# Patient Record
Sex: Female | Born: 2000
Health system: Southern US, Community
[De-identification: ages and names within clinical notes are randomized; demographics above are authoritative.]

## PROBLEM LIST (undated history)

## (undated) DIAGNOSIS — T7840XA Allergy, unspecified, initial encounter: Secondary | ICD-10-CM

## (undated) DIAGNOSIS — K121 Other forms of stomatitis: Secondary | ICD-10-CM

## (undated) DIAGNOSIS — L709 Acne, unspecified: Secondary | ICD-10-CM

## (undated) DIAGNOSIS — R011 Cardiac murmur, unspecified: Secondary | ICD-10-CM

## (undated) HISTORY — DX: Allergy, unspecified, initial encounter: T78.40XA

## (undated) HISTORY — DX: Acne, unspecified: L70.9

## (undated) HISTORY — DX: Cardiac murmur, unspecified: R01.1

## (undated) HISTORY — DX: Other forms of stomatitis: K12.1

---
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
## 2022-01-19 ENCOUNTER — Other Ambulatory Visit (HOSPITAL_COMMUNITY): Payer: Self-pay

## 2022-01-21 ENCOUNTER — Ambulatory Visit (INDEPENDENT_AMBULATORY_CARE_PROVIDER_SITE_OTHER): Payer: 59 | Admitting: Psychology

## 2022-01-21 ENCOUNTER — Encounter: Payer: Self-pay | Admitting: Family Medicine

## 2022-01-21 DIAGNOSIS — F411 Generalized anxiety disorder: Secondary | ICD-10-CM

## 2022-01-21 NOTE — Progress Notes (Signed)
[facility] Counselor/Therapist Progress Note  Patient ID: Sarah Herrera, MRN: [id number],    Date: 01/21/2022  Time Spent: 60 minutes  Treatment Type: Individual Therapy  Reported Symptoms: anxiety, worry  Mental Status Exam: Appearance:  Casual     Behavior: Appropriate  Motor: Restlestness  Speech/Language:  Normal Rate  Affect: Appropriate  Mood: anxious  Thought process: normal  Thought content:   WNL  Sensory/Perceptual disturbances:   WNL  Orientation: oriented to person, place, time/date, and situation  Attention: Good  Concentration: Good  Memory: WNL  Fund of knowledge:  Good  Insight:   Good  Judgment:  Good  Impulse Control: Good   Risk Assessment: Danger to Self:  No Self-injurious Behavior: No Danger to Others: No Duty to Warn:no Physical Aggression / Violence:No  Access to Firearms a concern: No  Gang Involvement:No   Subjective: The patient was seen today for an individual  therapy session via video visit.  The patient gave verbal consent for the session to be a video on WebEx.  The patient was in her room alone and therapist was in the office.  The patient presents as pleasant and cooperative.  The patient seems a little less anxious today than she has.  The patient reports that she is finished with school and she is supposed to graduate in 6 days.  The patient states that she went to the prom and to a [employer] and all of those things were great.  She still struggles with going back and forth between her parents and feeling supported by her father's family specifically. We talked about her going to [place] next week and she is excited about that.  We also discussed her going to [employer] and she feels excited about that possibility as well.  I am sure she will have more anxiety but closer to the time comes for her to leave however we will deal with that as we go.  Interventions: Cognitive Behavioral Therapy,  Assertiveness/Communication, and Insight-Oriented  Diagnosis:Generalized anxiety disorder  Plan: Please see plan in Therapy charts with target date of 03/04/2022 for goals and progress.  The patient approved this plan and will be seen monthly.  Jacqulyn Barresi G Danice Dippolito, LCSW                  Shantel Wesely G Charm Stenner, LCSW               Ramonita Koenig G Chirstine Defrain, LCSW               Timi Reeser G Kimball Appleby, LCSW

## 2022-02-18 ENCOUNTER — Ambulatory Visit (INDEPENDENT_AMBULATORY_CARE_PROVIDER_SITE_OTHER): Payer: 59 | Admitting: Psychology

## 2022-02-18 DIAGNOSIS — F411 Generalized anxiety disorder: Secondary | ICD-10-CM | POA: Diagnosis not present

## 2022-02-18 NOTE — Progress Notes (Signed)
[facility] Counselor/Therapist Progress Note  Patient ID: Sarah Herrera, MRN: [id number],    Date: 02/18/2022  Time Spent: 60 minutes  Treatment Type: Individual Therapy  Reported Symptoms: anxiety, worry  Mental Status Exam: Appearance:  Casual     Behavior: Appropriate  Motor: Restlestness  Speech/Language:  Normal Rate  Affect: Appropriate  Mood: normal  Thought process: normal  Thought content:   WNL  Sensory/Perceptual disturbances:   WNL  Orientation: oriented to person, place, time/date, and situation  Attention: Good  Concentration: Good  Memory: WNL  Fund of knowledge:  Good  Insight:   Good  Judgment:  Good  Impulse Control: Good   Risk Assessment: Danger to Self:  No Self-injurious Behavior: No Danger to Others: No Duty to Warn:no Physical Aggression / Violence:No  Access to Firearms a concern: No  Gang Involvement:No   Subjective: The patient was seen today for an individual  therapy session via video visit.  The patient gave verbal consent for the session to be a video on WebEx.  The patient was in her room alone and therapist was in the office.  The patient presents as pleasant and cooperative.  The patient graduated from high school and went to [place] since I saw her last.  The patient reports that she is doing very well right now.  She is doing better with her anxiety because I think she has decided that sometimes it is other people's problems as opposed to hers and she is not trying to please everyone anymore.  We talked about her continuing that attitude when she goes to college and she is feeling very excited about her summer and going off to school.  She will likely get a therapist when she goes to state and we will discontinue our sessions at that point in time.  Interventions: Cognitive Behavioral Therapy, Assertiveness/Communication, and Insight-Oriented  Diagnosis:Generalized anxiety disorder  Plan: Client Abilities/Strengths   Intelligent, insightful, family support  Client Treatment Preferences  Outpatient Individual therapy  Client Statement of Needs  "I need to learn to manage my stress and anxiety"  Treatment Level  Outpatient Individual therapy  Symptoms  Excessive anxiety, worry, or fear that markedly exceeds the normal level for the client's stage of  development.:  (Status: improved). High level of motor tension, such as  restlessness, tiredness, shakiness, or muscle tension.:  (Status: improved).  Problems Addressed  Anxiety  Goals 1. Stabilize anxiety level while increasing ability to function on a daily  basis. Objective Learn and implement calming skills to reduce overall anxiety and manage anxiety symptoms. Target Date: 2023-03-05 Frequency: Monthly Progress: 60 Modality: individual Related Interventions 1. Teach the client calming skills (e.g., progressive muscle relaxation, guided imagery, slow  diaphragmatic breathing) and how to discriminate better between relaxation and tension; teach  the client how to apply these skills to his/her daily life. Objective Identify, challenge, and replace fearful self-talk with positive, realistic, and empowering self-talk. Target Date: 2023-03-05 Frequency: Monthly Progress: 60 Modality: individual Related Interventions 1. Explore the client's schema and self-talk that mediate his/her fear response; challenge the  biases; assist him/her in replacing the distorted messages with reality-based alternatives and  positive self-talk that will increase his/her self-confidence in coping with irrational fears or  worries. Objective Describe current and past experiences with specific fears, prominent worries, and anxiety symptoms,  including their impact on functioning and attempts to resolve them. Target Date: 2023-03-05 Frequency: Monthly Progress: 60 Modality: individual Related Interventions 1. Assess the focus, excessiveness, and  uncontrollability of  the client's fears and worries, and the  type, frequency, intensity, and duration of his/her anxiety symptoms (e.g., use The Anxiety  Disorders Interview Schedule - Parent Version or Child Version; consider assigning "Finding  and Losing Your Anxiety" and/or "What Makes Me Anxious" in the Adolescent Psychotherapy  Homework Planner by Sevier Blas, and McInnis). 2. Actively build the level of trust with the client through consistent eye contact, active listening,  unconditional positive regard, and warm acceptance to help increase his/her ability to identify  and express concerns. Objective Verbalize an understanding of how thoughts, physical feelings, and behavioral actions contribute to  anxiety and its treatment. Target Date: 2023-03-05 Frequency: Monthly Progress: 60 Modality: individual  Diagnosis  F41.1 Generalized Anxiety  Disorder  Conditions For Discharge Achievement of treatment goals and objectives.  The patient approved this plan and will be seen monthly.  Khai Torbert G Adely Facer, LCSW                  Paullette Mckain G Deb Loudin, LCSW               Coolidge Gossard G Jolyne Laye, LCSW               Kiaraliz Rafuse G Scottlynn Lindell, LCSW               Calan Doren G Nikea Settle, LCSW

## 2022-03-17 ENCOUNTER — Encounter: Payer: Self-pay | Admitting: Family Medicine

## 2022-03-17 ENCOUNTER — Ambulatory Visit (INDEPENDENT_AMBULATORY_CARE_PROVIDER_SITE_OTHER): Payer: 59 | Admitting: *Deleted

## 2022-03-17 DIAGNOSIS — Z23 Encounter for immunization: Secondary | ICD-10-CM

## 2022-03-19 ENCOUNTER — Ambulatory Visit (INDEPENDENT_AMBULATORY_CARE_PROVIDER_SITE_OTHER): Payer: 59 | Admitting: Psychology

## 2022-03-19 DIAGNOSIS — F411 Generalized anxiety disorder: Secondary | ICD-10-CM

## 2022-03-19 NOTE — Progress Notes (Signed)
[facility] Counselor/Therapist Progress Note  Patient ID: Sarah Herrera, MRN: [id number],    Date: 03/19/2022  Time Spent: 60 minutes  Treatment Type: Individual Therapy  Reported Symptoms: anxiety, worry  Mental Status Exam: Appearance:  Casual     Behavior: Appropriate  Motor: Restlestness  Speech/Language:  Normal Rate  Affect: Appropriate  Mood: normal  Thought process: normal  Thought content:   WNL  Sensory/Perceptual disturbances:   WNL  Orientation: oriented to person, place, time/date, and situation  Attention: Good  Concentration: Good  Memory: WNL  Fund of knowledge:  Good  Insight:   Good  Judgment:  Good  Impulse Control: Good   Risk Assessment: Danger to Self:  No Self-injurious Behavior: No Danger to Others: No Duty to Warn:no Physical Aggression / Violence:No  Access to Firearms a concern: No  Gang Involvement:No   Subjective: The patient was seen today for an individual  therapy session via video visit.  The patient gave verbal consent for the session to be a video on WebEx.  The patient was in her room alone and therapist was in the office.  The patient presents as pleasant and cooperative but seems a little more anxious this time.  The patient states that she is having difficulty with her family members.  We talked about how they are responding to her and I encouraged her not to take what they are doing personally as it seems they do not know how to communicate very well with her.  Encouraged her to continue to focus on the good things that are going on with her as she is going to college within the month.  The patient plans to try to find a therapist over and [facility] since she will be going to state .I have her scheduled one more time between now and the time that she moves.  We will begin termination process. Interventions: Cognitive Behavioral Therapy, Assertiveness/Communication, and Insight-Oriented  Diagnosis:Generalized anxiety  disorder  Plan: Client Abilities/Strengths  Intelligent, insightful, family support  Client Treatment Preferences  Outpatient Individual therapy  Client Statement of Needs  "I need to learn to manage my stress and anxiety"  Treatment Level  Outpatient Individual therapy  Symptoms  Excessive anxiety, worry, or fear that markedly exceeds the normal level for the client's stage of  development.:  (Status: improved). High level of motor tension, such as  restlessness, tiredness, shakiness, or muscle tension.:  (Status: improved).  Problems Addressed  Anxiety  Goals 1. Stabilize anxiety level while increasing ability to function on a daily  basis. Objective Learn and implement calming skills to reduce overall anxiety and manage anxiety symptoms. Target Date: 2023-03-05 Frequency: Monthly Progress: 60 Modality: individual Related Interventions 1. Teach the client calming skills (e.g., progressive muscle relaxation, guided imagery, slow  diaphragmatic breathing) and how to discriminate better between relaxation and tension; teach  the client how to apply these skills to his/her daily life. Objective Identify, challenge, and replace fearful self-talk with positive, realistic, and empowering self-talk. Target Date: 2023-03-05 Frequency: Monthly Progress: 60 Modality: individual Related Interventions 1. Explore the client's schema and self-talk that mediate his/her fear response; challenge the  biases; assist him/her in replacing the distorted messages with reality-based alternatives and  positive self-talk that will increase his/her self-confidence in coping with irrational fears or  worries. Objective Describe current and past experiences with specific fears, prominent worries, and anxiety symptoms,  including their impact on functioning and attempts to resolve them. Target Date: 2023-03-05 Frequency: Monthly Progress:  60 Modality: individual Related Interventions 1. Assess the  focus, excessiveness, and uncontrollability of the client's fears and worries, and the  type, frequency, intensity, and duration of his/her anxiety symptoms (e.g., use The Anxiety  Disorders Interview Schedule - Parent Version or Child Version; consider assigning "Finding  and Losing Your Anxiety" and/or "What Makes Me Anxious" in the Adolescent Psychotherapy  Homework Planner by Mason Blas, and McInnis). 2. Actively build the level of trust with the client through consistent eye contact, active listening,  unconditional positive regard, and warm acceptance to help increase his/her ability to identify  and express concerns. Objective Verbalize an understanding of how thoughts, physical feelings, and behavioral actions contribute to  anxiety and its treatment. Target Date: 2023-03-05 Frequency: Monthly Progress: 60 Modality: individual  Diagnosis  F41.1 Generalized Anxiety  Disorder  Conditions For Discharge Achievement of treatment goals and objectives.  The patient approved this plan and will be seen monthly.  Eureka Valdes G Darnell Jeschke, LCSW                  Rashon Rezek G Aneth Schlagel, LCSW               Nikayla Madaris G Wen Munford, LCSW               Sundae Maners G Shevonne Wolf, LCSW               Machai Desmith G Yareliz Thorstenson, LCSW               Shawntavia Saunders G Esther Broyles, LCSW

## 2022-03-23 ENCOUNTER — Ambulatory Visit: Payer: 59 | Admitting: Dermatology

## 2022-03-23 DIAGNOSIS — L700 Acne vulgaris: Secondary | ICD-10-CM | POA: Diagnosis not present

## 2022-03-23 MED ORDER — WINLEVI 1 % EX CREA: TOPICAL_CREAM | CUTANEOUS | 6 refills | Status: DC

## 2022-03-23 MED ORDER — ARAZLO 0.045 % EX LOTN: TOPICAL_LOTION | CUTANEOUS | 6 refills | Status: DC

## 2022-04-07 ENCOUNTER — Ambulatory Visit: Payer: 59 | Admitting: Psychology

## 2022-04-13 ENCOUNTER — Ambulatory Visit (INDEPENDENT_AMBULATORY_CARE_PROVIDER_SITE_OTHER): Payer: 59 | Admitting: Psychology

## 2022-04-13 DIAGNOSIS — F411 Generalized anxiety disorder: Secondary | ICD-10-CM | POA: Diagnosis not present

## 2022-04-13 NOTE — Progress Notes (Signed)
[facility] Counselor/Therapist Progress Note  Patient ID: TASHAE INDA, MRN: [id number],    Date: 04/13/2022  Time Spent: 55 minutes  Treatment Type: Individual Therapy  Reported Symptoms: anxiety, worry  Mental Status Exam: Appearance:  Casual     Behavior: Appropriate  Motor: normal  Speech/Language:  Normal Rate  Affect: Appropriate  Mood: normal  Thought process: normal  Thought content:   WNL  Sensory/Perceptual disturbances:   WNL  Orientation: oriented to person, place, time/date, and situation  Attention: Good  Concentration: Good  Memory: WNL  Fund of knowledge:  Good  Insight:   Good  Judgment:  Good  Impulse Control: Good   Risk Assessment: Danger to Self:  No Self-injurious Behavior: No Danger to Others: No Duty to Warn:no Physical Aggression / Violence:No  Access to Firearms a concern: No  Gang Involvement:No   Subjective: The patient was seen today for an individual  therapy session via video visit.  The patient gave verbal consent for the session to be a video on WebEx.  The patient was in her room alone and therapist was in the office.  The patient presents as pleasant and cooperative .  The patient reports that she is going to school on August 17.  She has several of her things already packed and she is excited about going.  We talked about how she feels about things and she seems to be doing better with managing her emotions and her anxiety because it seems that she is not going to have to go back and forth between her parents and try to please everyone.  The patient states that she had a good time when she went to her orientation a few weeks ago and feels good about making friends and about acclimating to the surroundings.  The patient reports that she does not necessarily want to start therapy with a new therapist they are because it seems that what they have to offer is a different therapist every time they meet.  The patient states that  she might try therapy if she needs to with them and then will cancel appointments as needed.  I made 2 appointments for her in the next semester so that she would have some check-in if needed.  She is aware that she can contact me if she needs to be seen sooner.  Interventions: Cognitive Behavioral Therapy, Assertiveness/Communication, and Insight-Oriented  Diagnosis:Generalized anxiety disorder  Plan: Client Abilities/Strengths  Intelligent, insightful, family support  Client Treatment Preferences  Outpatient Individual therapy  Client Statement of Needs  "I need to learn to manage my stress and anxiety"  Treatment Level  Outpatient Individual therapy  Symptoms  Excessive anxiety, worry, or fear that markedly exceeds the normal level for the client's stage of  development.:  (Status: improved). High level of motor tension, such as  restlessness, tiredness, shakiness, or muscle tension.:  (Status: improved).  Problems Addressed  Anxiety  Goals 1. Stabilize anxiety level while increasing ability to function on a daily  basis. Objective Learn and implement calming skills to reduce overall anxiety and manage anxiety symptoms. Target Date: 2023-03-05 Frequency: Monthly Progress: 60 Modality: individual Related Interventions 1. Teach the client calming skills (e.g., progressive muscle relaxation, guided imagery, slow  diaphragmatic breathing) and how to discriminate better between relaxation and tension; teach  the client how to apply these skills to his/her daily life. Objective Identify, challenge, and replace fearful self-talk with positive, realistic, and empowering self-talk. Target Date: 2023-03-05 Frequency: Monthly Progress: 60  Modality: individual Related Interventions 1. Explore the client's schema and self-talk that mediate his/her fear response; challenge the  biases; assist him/her in replacing the distorted messages with reality-based alternatives and  positive  self-talk that will increase his/her self-confidence in coping with irrational fears or  worries. Objective Describe current and past experiences with specific fears, prominent worries, and anxiety symptoms,  including their impact on functioning and attempts to resolve them. Target Date: 2023-03-05 Frequency: Monthly Progress: 60 Modality: individual Related Interventions 1. Assess the focus, excessiveness, and uncontrollability of the client's fears and worries, and the  type, frequency, intensity, and duration of his/her anxiety symptoms (e.g., use The Anxiety  Disorders Interview Schedule - Parent Version or Child Version; consider assigning "Finding  and Losing Your Anxiety" and/or "What Makes Me Anxious" in the Adolescent Psychotherapy  Homework Planner by [place] Blas, and McInnis). 2. Actively build the level of trust with the client through consistent eye contact, active listening,  unconditional positive regard, and warm acceptance to help increase his/her ability to identify  and express concerns. Objective Verbalize an understanding of how thoughts, physical feelings, and behavioral actions contribute to  anxiety and its treatment. Target Date: 2023-03-05 Frequency: Monthly Progress: 60 Modality: individual  Diagnosis  F41.1 Generalized Anxiety  Disorder  Conditions For Discharge Achievement of treatment goals and objectives.  The patient approved this plan and will be seen monthly.  Azalya Galyon G Ishani Goldwasser, LCSW                  Trinidee Schrag G Fifi Schindler, LCSW               Ahmoni Edge G Quinesha Selinger, LCSW               Johnston Maddocks G Tekoa Hamor, LCSW               Arlyn Buerkle G Mitch Arquette, LCSW               Sue Mcalexander G Esaiah Wanless, LCSW               Cordel Drewes G Dorothymae Maciver, LCSW

## 2022-04-14 ENCOUNTER — Encounter: Payer: Self-pay | Admitting: Dermatology

## 2022-04-14 NOTE — Progress Notes (Signed)
   Follow-Up Visit   Subjective  Sarah Herrera is a 21 y.o. female who presents for the following: Acne ( Here for acne breakout. Patient was on Accutane 2 years ago. Now she is breaking out again. No treatment at this time. ).  Acne flaring, history of clearance on estrogen Location:  Duration:  Quality:  Associated Signs/Symptoms: Modifying Factors:  Severity:  Timing: Context:   Objective  Well appearing patient in no apparent distress; mood and affect are within normal limits. Head - Anterior (Face) Mostly superficial inflammatory, mostly facial acne.  All treatment options reviewed including repeat course of isotretinoin which undoubtedly would work but she may respond to less tedious options.    A focused examination was performed including head and neck.. Relevant physical exam findings are noted in the Assessment and Plan.   Assessment & Plan    Acne vulgaris Head - Anterior (Face)  Will initially use topical clascoterone 5 nights weekly, Arazlo on Monday and Friday night.  10-week trial and contact office with follow-up status  Tazarotene (ARAZLO) 0.045 % LOTN - Head - Anterior (Face) Apply to affected Monday & friday  Clascoterone (WINLEVI) 1 % CREA - Head - Anterior (Face) Apply to affected area qhs -Tuesday, weds, sat & Sunday      I, Janalyn Harder, MD, have reviewed all documentation for this visit.  The documentation on 04/14/22 for the exam, diagnosis, procedures, and orders are all accurate and complete.

## 2022-04-19 ENCOUNTER — Ambulatory Visit (INDEPENDENT_AMBULATORY_CARE_PROVIDER_SITE_OTHER): Payer: 59 | Admitting: *Deleted

## 2022-04-19 DIAGNOSIS — Z23 Encounter for immunization: Secondary | ICD-10-CM | POA: Diagnosis not present

## 2022-04-20 ENCOUNTER — Other Ambulatory Visit: Payer: Self-pay | Admitting: Family Medicine

## 2022-04-20 DIAGNOSIS — K219 Gastro-esophageal reflux disease without esophagitis: Secondary | ICD-10-CM

## 2022-04-21 ENCOUNTER — Other Ambulatory Visit (HOSPITAL_COMMUNITY): Payer: Self-pay

## 2022-04-21 MED ORDER — OMEPRAZOLE 40 MG PO CPDR
40.0000 mg | DELAYED_RELEASE_CAPSULE | Freq: Every day | ORAL | 1 refills | Status: DC
  Filled 2022-04-21: qty 30, 30d supply, fill #0
  Filled 2022-12-28: qty 30, 30d supply, fill #1

## 2022-06-15 ENCOUNTER — Ambulatory Visit (INDEPENDENT_AMBULATORY_CARE_PROVIDER_SITE_OTHER): Payer: 59 | Admitting: Psychology

## 2022-06-15 DIAGNOSIS — F411 Generalized anxiety disorder: Secondary | ICD-10-CM

## 2022-06-16 NOTE — Progress Notes (Signed)
[facility] Counselor/Therapist Progress Note  Patient ID: Sarah Herrera, MRN: [id number],    Date: 06/15/2022  Time Spent: 60 minutes  Treatment Type: Individual Therapy  Reported Symptoms: anxiety, worry  Mental Status Exam: Appearance:  Casual     Behavior: Appropriate  Motor: normal  Speech/Language:  Normal Rate  Affect: Appropriate  Mood: normal  Thought process: normal  Thought content:   WNL  Sensory/Perceptual disturbances:   WNL  Orientation: oriented to person, place, time/date, and situation  Attention: Good  Concentration: Good  Memory: WNL  Fund of knowledge:  Good  Insight:   Good  Judgment:  Good  Impulse Control: Good   Risk Assessment: Danger to Self:  No Self-injurious Behavior: No Danger to Others: No Duty to Warn:no Physical Aggression / Violence:No  Access to Firearms a concern: No  Gang Involvement:No   Subjective: The patient was seen today for an individual  therapy session via video visit.  The patient gave verbal consent for the session to be a video on WebEx.  The patient was in her room alone and therapist was in the office.  The patient presents as pleasant and cooperative .  The patient does not seem as anxious as she did when she was living at home.  The patient is now at school and reports that things seem to be going well.  The patient states that she has made several friends and is doing well with her classes.  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  We talked about her continuing to confront issues as they arise instead of letting things simmer.  She states that she has been doing more of this and she said that I would be very proud of her for confronting issues as they arise.  The patient did have some anxiety about the situation and is a real and how people are perceiving persons of Jewish heritage.  We talked about her continuing to keep herself safe  and trying to stay as positive as possible.  The patient seems to be doing very well and would like to continue to have periodic sessions to check in as she has not started any kind of therapy over and [place].  Interventions: Cognitive Behavioral Therapy, Assertiveness/Communication, and Insight-Oriented  Diagnosis:Generalized anxiety disorder  Plan: Client Abilities/Strengths  Intelligent, insightful, family support  Client Treatment Preferences  Outpatient Individual therapy  Client Statement of Needs  "I need to learn to manage my stress and anxiety"  Treatment Level  Outpatient Individual therapy  Symptoms  Excessive anxiety, worry, or fear that markedly exceeds the normal level for the client's stage of  development.:  (Status: improved). High level of motor tension, such as  restlessness, tiredness, shakiness, or muscle tension.:  (Status: improved).  Problems Addressed  Anxiety  Goals 1. Stabilize anxiety level while increasing ability to function on a daily  basis. Objective Learn and implement calming skills to reduce overall anxiety and manage anxiety symptoms. Target Date: 2023-03-05 Frequency: Monthly Progress: 60 Modality: individual Related Interventions 1. Teach the client calming skills (e.g., progressive muscle relaxation, guided imagery, slow  diaphragmatic breathing) and how to discriminate better between relaxation and tension; teach  the client how to apply these skills to his/her daily life. Objective Identify, challenge, and replace fearful self-talk with positive, realistic, and empowering self-talk. Target Date: 2023-03-05 Frequency: Monthly Progress: 60 Modality: individual Related Interventions 1. Explore  the client's schema and self-talk that mediate his/her fear response; challenge the  biases; assist him/her in replacing the distorted messages with reality-based alternatives and  positive self-talk that will increase his/her self-confidence in  coping with irrational fears or  worries. Objective Describe current and past experiences with specific fears, prominent worries, and anxiety symptoms,  including their impact on functioning and attempts to resolve them. Target Date: 2023-03-05 Frequency: Monthly Progress: 60 Modality: individual Related Interventions 1. Assess the focus, excessiveness, and uncontrollability of the client's fears and worries, and the  type, frequency, intensity, and duration of his/her anxiety symptoms (e.g., use The Anxiety  Disorders Interview Schedule - Parent Version or Child Version; consider assigning "Finding  and Losing Your Anxiety" and/or "What Makes Me Anxious" in the Adolescent Psychotherapy  Homework Planner by Baird Cancer, and McInnis). 2. Actively build the level of trust with the client through consistent eye contact, active listening,  unconditional positive regard, and warm acceptance to help increase his/her ability to identify  and express concerns. Objective Verbalize an understanding of how thoughts, physical feelings, and behavioral actions contribute to  anxiety and its treatment. Target Date: 2023-03-05 Frequency: Monthly Progress: 60 Modality: individual  Diagnosis  F41.1 Generalized Anxiety  Disorder  Conditions For Discharge Achievement of treatment goals and objectives.  The patient approved this plan and will be seen monthly.  Colby Reels G Corian Handley, LCSW

## 2022-07-13 ENCOUNTER — Encounter: Payer: Self-pay | Admitting: Family Medicine

## 2022-07-14 DIAGNOSIS — R590 Localized enlarged lymph nodes: Secondary | ICD-10-CM | POA: Diagnosis not present

## 2022-07-28 ENCOUNTER — Ambulatory Visit: Payer: 59 | Admitting: Family Medicine

## 2022-08-11 ENCOUNTER — Ambulatory Visit (INDEPENDENT_AMBULATORY_CARE_PROVIDER_SITE_OTHER): Payer: 59 | Admitting: Psychology

## 2022-08-11 DIAGNOSIS — F411 Generalized anxiety disorder: Secondary | ICD-10-CM | POA: Diagnosis not present

## 2022-08-11 NOTE — Progress Notes (Signed)
[place] [facility] Counselor/Therapist Progress Note  Patient ID: Sarah Herrera, MRN: [id number],    Date: 08/11/2022  Time Spent: 60 minutes  Treatment Type: Individual Therapy  Reported Symptoms: anxiety, worry  Mental Status Exam: Appearance:  Casual     Behavior: Appropriate  Motor: normal  Speech/Language:  Normal Rate  Affect: Appropriate  Mood: normal  Thought process: normal  Thought content:   WNL  Sensory/Perceptual disturbances:   WNL  Orientation: oriented to person, place, time/date, and situation  Attention: Good  Concentration: Good  Memory: WNL  Fund of knowledge:  Good  Insight:   Good  Judgment:  Good  Impulse Control: Good   Risk Assessment: Danger to Self:  No Self-injurious Behavior: No Danger to Others: No Duty to Warn:no Physical Aggression / Violence:No  Access to Firearms a concern: No  Gang Involvement:No   Subjective: The patient was seen today for an individual  therapy session via video visit.  The patient gave verbal consent for the session to be a video on WebEx.  The patient was in outside alone and therapist was in the office.  The patient presents as pleasant and cooperative .  The patient states that the semester is almost over.  He has been doing well and she reports that her anxiety has been manageable.  She reports that she is in a new relationship and we talked about this and we talked about some of the things that make it a healthy relationship.  She states that she is going home next Tuesday and feels a little anxious about this but seems to be handling things well.  She does report that there are some problems with antisimilar to zone at her school and she states that she is not wearing any outward signs of her faith because of concerns about that.  We talked about how she is coping and she seems to be doing relatively well we scheduled another appointment in February. Interventions: Cognitive Behavioral Therapy,  Assertiveness/Communication, and Insight-Oriented  Diagnosis:Generalized anxiety disorder  Plan: Client Abilities/Strengths  Intelligent, insightful, family support  Client Treatment Preferences  Outpatient Individual therapy  Client Statement of Needs  "I need to learn to manage my stress and anxiety"  Treatment Level  Outpatient Individual therapy  Symptoms  Excessive anxiety, worry, or fear that markedly exceeds the normal level for the client's stage of  development.:  (Status: improved). High level of motor tension, such as  restlessness, tiredness, shakiness, or muscle tension.:  (Status: improved).  Problems Addressed  Anxiety  Goals 1. Stabilize anxiety level while increasing ability to function on a daily  basis. Objective Learn and implement calming skills to reduce overall anxiety and manage anxiety symptoms. Target Date: 2023-03-05 Frequency: Monthly Progress: 60 Modality: individual Related Interventions 1. Teach the client calming skills (e.g., progressive muscle relaxation, guided imagery, slow  diaphragmatic breathing) and how to discriminate better between relaxation and tension; teach  the client how to apply these skills to his/her daily life. Objective Identify, challenge, and replace fearful self-talk with positive, realistic, and empowering self-talk. Target Date: 2023-03-05 Frequency: Monthly Progress: 60 Modality: individual Related Interventions 1. Explore the client's schema and self-talk that mediate his/her fear response; challenge the  biases; assist him/her in replacing the distorted messages with reality-based alternatives and  positive self-talk that will increase his/her self-confidence in coping with irrational fears or  worries. Objective Describe current and past experiences with specific fears, prominent worries, and anxiety symptoms,  including their impact on functioning  and attempts to resolve them. Target Date: 2023-03-05 Frequency:  Monthly Progress: 60 Modality: individual Related Interventions 1. Assess the focus, excessiveness, and uncontrollability of the client's fears and worries, and the  type, frequency, intensity, and duration of his/her anxiety symptoms (e.g., use The Anxiety  Disorders Interview Schedule - Parent Version or Child Version; consider assigning "Finding  and Losing Your Anxiety" and/or "What Makes Me Anxious" in the Adolescent Psychotherapy  Homework Planner by [place] Blas, and McInnis). 2. Actively build the level of trust with the client through consistent eye contact, active listening,  unconditional positive regard, and warm acceptance to help increase his/her ability to identify  and express concerns. Objective Verbalize an understanding of how thoughts, physical feelings, and behavioral actions contribute to  anxiety and its treatment. Target Date: 2023-03-05 Frequency: Monthly Progress: 60 Modality: individual  Diagnosis  F41.1 Generalized Anxiety  Disorder  Conditions For Discharge Achievement of treatment goals and objectives.  The patient approved this plan and will be seen monthly.  Sarah Herrera G Helga Asbury, LCSW

## 2022-08-13 ENCOUNTER — Ambulatory Visit: Payer: 59 | Admitting: Psychology

## 2022-08-18 NOTE — Progress Notes (Unsigned)
HPI: Sarah Herrera is a 21 y.o. female with PMHx significant for migraine headaches without aura,anxiety,and IBS here today to discuss birth control options.  She reports having taken an estrogen-based pill approximately a year ago but experienced headaches and exacerbation of migraines, leading her to discontinue its use. She is now interested in starting a progesterone-only pill, as she still experiences some headaches, albeit not migraines, two days before her mentrual period, not severe.  The patient confirms she is sexually active and her last menstrual period was on December 9th/2023. She describes her periods as heavy for a day or two and associated cramps.States that she is very consistent and regular. She denies any abnormal vaginal bleeding or discharge.  Additionally, she mentions a "bump" behind left ear present for about a month and a half. She reports that it has gotten smaller and is no longer painful. [facility] problem [facility] noted and positive. States that she has not had associated symptoms like sore throat,fever, night sweats,fatigue, cough,or abnormal wt loss. She also reports having a normal CBC.  Review of Systems  Constitutional:  Negative for activity change and appetite change.  HENT:  Negative for ear pain, mouth sores and nosebleeds.   Eyes:  Negative for redness and visual disturbance.  Respiratory:  Negative for shortness of breath and wheezing.   Cardiovascular:  Negative for chest pain, palpitations and leg swelling.  Gastrointestinal:  Negative for abdominal pain, nausea and vomiting.       Negative for changes in bowel habits.  Genitourinary:  Negative for decreased urine volume, dysuria and hematuria.  Musculoskeletal:  Negative for arthralgias and myalgias.  Skin:  Negative for rash.  Neurological:  Negative for syncope, facial asymmetry and weakness.  Hematological:  Negative for adenopathy. Does not bruise/bleed easily.   See other pertinent positives and negatives in HPI.  Current Outpatient Medications on File Prior to Visit  Medication Sig Dispense Refill   omeprazole (PRILOSEC) 40 MG capsule Take 1 capsule by mouth daily. 30 capsule 1   No current facility-administered medications on file prior to visit.   Past Medical History:  Diagnosis Date   Acne    Allergy    Heart murmur    as an infant   Mouth ulcers    No Known Allergies  Social History   Socioeconomic History   Marital status: Single    Spouse name: Not on file   Number of children: Not on file   Years of education: Not on file   Highest education level: 12th grade  Occupational History   Not on file  Tobacco Use   Smoking status: Never   Smokeless tobacco: Never  Vaping Use   Vaping Use: Never used  Substance and Sexual Activity   Alcohol use: Never   Drug use: Never   Sexual activity: Not on file  Other Topics Concern   Not on file  Social History Narrative   Not on file   Social Determinants of Health   Financial Resource Strain: Low Risk  (08/20/2022)   Overall Financial Resource Strain (CARDIA)    Difficulty of Paying Living Expenses: Not hard at all  Food Insecurity: No Food Insecurity (08/20/2022)   Hunger Vital Sign    Worried About Running Out of Food in the Last Year: Never true    Ran Out of Food in the Last Year: Never true  Transportation Needs: No Transportation Needs (08/20/2022)   PRAPARE - Transportation  Lack of Transportation (Medical): No    Lack of Transportation (Non-Medical): No  Physical Activity: Sufficiently Active (08/20/2022)   Exercise Vital Sign    Days of Exercise per Week: 7 days    Minutes of Exercise per Session: 60 min  Stress: No Stress Concern Present (08/20/2022)   [employer] of Occupational Health - Occupational Stress Questionnaire    Feeling of Stress : Only a little  Social Connections: Moderately Integrated (08/20/2022)   Social Connection and Isolation  Panel [NHANES]    Frequency of Communication with Friends and Family: More than three times a week    Frequency of Social Gatherings with Friends and Family: More than three times a week    Attends Religious Services: More than 4 times per year    Active Member of Clubs or Organizations: Yes    Attends Banker Meetings: More than 4 times per year    Marital Status: Never married    Vitals:   08/20/22 0942  BP: 118/70  Pulse: 60  Resp: 12  Temp: 98.6 F (37 C)  SpO2: 98%   Body mass index is 21.5 kg/m.  Physical Exam Vitals and nursing note reviewed.  Constitutional:      General: She is not in acute distress.    Appearance: She is well-developed.  HENT:     Head: Normocephalic and atraumatic.     Right Ear: Tympanic membrane, ear canal and external ear normal.     Left Ear: Tympanic membrane, ear canal and external ear normal.     Ears:      Mouth/Throat:     Mouth: Mucous membranes are moist.     Pharynx: Oropharynx is clear.  Eyes:     Conjunctiva/sclera: Conjunctivae normal.  Cardiovascular:     Rate and Rhythm: Normal rate and regular rhythm.     Pulses:          Dorsalis pedis pulses are 2+ on the right side and 2+ on the left side.     Heart sounds: No murmur heard. Pulmonary:     Effort: Pulmonary effort is normal. No respiratory distress.     Breath sounds: Normal breath sounds.  Abdominal:     Palpations: Abdomen is soft. There is no hepatomegaly or mass.     Tenderness: There is no abdominal tenderness.  Lymphadenopathy:     Head:     Right side of head: No preauricular or posterior auricular adenopathy.     Left side of head: No preauricular or posterior auricular adenopathy.     Cervical: No cervical adenopathy.  Skin:    General: Skin is warm.     Findings: Rash (Facial acne) present. No erythema. Rash is papular and pustular.  Neurological:     General: No focal deficit present.     Mental Status: She is alert and oriented to  person, place, and time.     Cranial Nerves: No cranial nerve deficit.     Gait: Gait normal.  Psychiatric:        Mood and Affect: Mood and affect normal.   ASSESSMENT AND PLAN:  Sarah Herrera was seen today for contraception and follow-up.  Diagnoses and all orders for this visit:  Lesion of subcutaneous tissue On left mastoid area, slowly decreasing in size. Hx and examination do not suggest a serious process. Reporting having CBC done at school, normal. Recommend continue monitoring and to let me know if new symptom or increasing in size.  Dysmenorrhea Ortho Micronor  started today, we discussed some side effects. Instructed to start medication with next menstrual period.  Encounter for initial prescription of contraceptive pills She has not tolerated combination pill in the past, exacerbated headaches/migraines. She is not interested in nexplanon or mirena IUD. She would like progesterone-only-pill, so recommend ortho Micronor. We discussed some side effects as well as effectiveness as birth control. She is to take daily around same time and instructed to start the first day of her next menstrual cycle. Pregnancy test negative today.  Return if symptoms worsen or fail to improve, for keep next appointment.  Katasha Riga G. [place], MD  [facility]. [place] office.

## 2022-08-20 ENCOUNTER — Ambulatory Visit: Payer: 59 | Admitting: Family Medicine

## 2022-08-20 ENCOUNTER — Other Ambulatory Visit (HOSPITAL_COMMUNITY): Payer: Self-pay

## 2022-08-20 VITALS — BP 118/70 | HR 60 | Temp 98.6°F | Resp 12 | Ht 67.0 in | Wt 137.2 lb

## 2022-08-20 DIAGNOSIS — N946 Dysmenorrhea, unspecified: Secondary | ICD-10-CM | POA: Diagnosis not present

## 2022-08-20 DIAGNOSIS — L989 Disorder of the skin and subcutaneous tissue, unspecified: Secondary | ICD-10-CM | POA: Diagnosis not present

## 2022-08-20 DIAGNOSIS — Z30011 Encounter for initial prescription of contraceptive pills: Secondary | ICD-10-CM | POA: Insufficient documentation

## 2022-08-20 MED ORDER — NORETHINDRONE 0.35 MG PO TABS
1.0000 | ORAL_TABLET | Freq: Every day | ORAL | 3 refills | Status: DC
Start: 2022-08-20 — End: 2023-07-14
  Filled 2022-08-20: qty 84, 84d supply, fill #0
  Filled 2022-11-19: qty 84, 84d supply, fill #1
  Filled 2023-02-06 – 2023-02-14 (×2): qty 84, 84d supply, fill #2
  Filled 2023-05-04: qty 84, 84d supply, fill #3

## 2022-08-20 NOTE — Assessment & Plan Note (Signed)
On left mastoid area, slowly decreasing in size. Hx and examination do not suggest a serious process. Reporting having CBC done at school, normal. Recommend continue monitoring and to let me know if new symptom or increasing in size.

## 2022-08-20 NOTE — Patient Instructions (Addendum)
A few things to remember from today's visit:  Is preferable starting medication on your next menstrual period and around the same time every daily. Condom or back up birth control method for at least 2-3 days.  If you need refills for medications you take chronically, please call your pharmacy. Do not use My Chart to request refills or for acute issues that need immediate attention. If you send a my chart message, it may take a few days to be addressed, specially if I am not in the office.  Please be sure medication list is accurate. If a new problem present, please set up appointment sooner than planned today.

## 2022-08-21 NOTE — Assessment & Plan Note (Signed)
Ortho Micronor started today, we discussed some side effects. Instructed to start medication with next menstrual period.

## 2022-08-21 NOTE — Assessment & Plan Note (Addendum)
She has not tolerated combination pill in the past, exacerbated headaches/migraines. She is not interested in nexplanon or mirena IUD. She would like progesterone-only-pill, so recommend ortho Micronor. We discussed some side effects as well as effectiveness as birth control. She is to take daily around same time and instructed to start the first day of her next menstrual cycle. Pregnancy test negative today.

## 2022-08-23 ENCOUNTER — Other Ambulatory Visit (HOSPITAL_COMMUNITY): Payer: Self-pay

## 2022-08-23 DIAGNOSIS — L708 Other acne: Secondary | ICD-10-CM | POA: Diagnosis not present

## 2022-08-23 MED ORDER — DOXYCYCLINE HYCLATE 100 MG PO CAPS
100.0000 mg | ORAL_CAPSULE | Freq: Two times a day (BID) | ORAL | 2 refills | Status: DC
  Filled 2022-08-23: qty 60, 30d supply, fill #0
  Filled 2022-09-07 – 2022-09-17 (×2): qty 60, 30d supply, fill #1
  Filled 2022-10-17: qty 60, 30d supply, fill #2

## 2022-08-23 MED ORDER — TRETINOIN 0.025 % EX CREA
TOPICAL_CREAM | CUTANEOUS | 2 refills | Status: DC
  Filled 2022-08-23: qty 45, 30d supply, fill #0
  Filled 2022-09-07 – 2022-09-17 (×2): qty 45, 30d supply, fill #1

## 2022-08-23 MED ORDER — SPIRONOLACTONE 50 MG PO TABS
50.0000 mg | ORAL_TABLET | Freq: Every day | ORAL | 3 refills | Status: DC
  Filled 2022-08-23: qty 60, 37d supply, fill #0
  Filled 2022-09-07 – 2022-09-22 (×5): qty 60, 37d supply, fill #1
  Filled 2022-10-30: qty 60, 37d supply, fill #2

## 2022-09-08 ENCOUNTER — Other Ambulatory Visit (HOSPITAL_COMMUNITY): Payer: Self-pay

## 2022-09-17 ENCOUNTER — Other Ambulatory Visit: Payer: Self-pay

## 2022-09-17 ENCOUNTER — Other Ambulatory Visit (HOSPITAL_COMMUNITY): Payer: Self-pay

## 2022-09-18 ENCOUNTER — Other Ambulatory Visit (HOSPITAL_COMMUNITY): Payer: Self-pay

## 2022-09-20 ENCOUNTER — Other Ambulatory Visit: Payer: Self-pay

## 2022-09-20 ENCOUNTER — Other Ambulatory Visit (HOSPITAL_COMMUNITY): Payer: Self-pay

## 2022-10-15 ENCOUNTER — Ambulatory Visit (INDEPENDENT_AMBULATORY_CARE_PROVIDER_SITE_OTHER): Payer: Commercial Managed Care - PPO | Admitting: Psychology

## 2022-10-15 DIAGNOSIS — F411 Generalized anxiety disorder: Secondary | ICD-10-CM

## 2022-10-17 NOTE — Progress Notes (Signed)
Sarah Herrera, Sarah Herrera,    Date: 10/15/2022  Time Spent: 60 minutes  Treatment Type: Individual Therapy  Reported Symptoms: anxiety, worry  Mental Status Exam: Appearance:  Casual     Behavior: Appropriate  Motor: normal  Speech/Language:  Normal Rate  Affect: Appropriate  Mood: anxious  Thought process: normal  Thought content:   WNL  Sensory/Perceptual disturbances:   WNL  Orientation: oriented to person, place, time/date, and situation  Attention: Good  Concentration: Good  Memory: WNL  Fund of knowledge:  Good  Insight:   Good  Judgment:  Good  Impulse Control: Good   Risk Assessment: Danger to Self:  No Self-injurious Behavior: No Danger to Others: No Duty to Warn:no Physical Aggression / Violence:No  Access to Firearms a concern: No  Gang Involvement:No   Subjective: The patient was seen today for an individual  therapy session via video visit.  The patient gave verbal consent for the session to be a video on WebEx.  The patient was inside alone and therapist was in the office.  The patient presents as somewhat anxious today.  The patient reports that she wanted to move her session up because one of her teachers from high school had committed suicide.  She reports that she was struggling with this and was not sure why.  We processed what had happened and it seems that the patient was relating to the teachers daughters and struggling with the fact that the teacher was somewhat selfish and did not have a concern about her daughters struggling with this.  The patient reported that this was exactly the way she was feeling and that she now had a better understanding of what was going on with her but she could not quite sort it out by herself.  The patient is doing well in school she continues to date Sarah Herrera, her boyfriend.  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  I did some education with her about how our personalities develop and how sometimes as we grow up we have a realization that her parents are just human.   Interventions: Cognitive Behavioral Therapy, Assertiveness/Communication, and Insight-Oriented  Diagnosis:Generalized anxiety disorder  Plan: Client Abilities/Strengths  Intelligent, insightful, family support  Client Treatment Preferences  Outpatient Individual therapy  Client Statement of Needs  "I need to learn to manage my stress and anxiety"  Treatment Level  Outpatient Individual therapy  Symptoms  Excessive anxiety, worry, or fear that markedly exceeds the normal level for the client's stage of  development.:  (Status: improved). High level of motor tension, such as  restlessness, tiredness, shakiness, or muscle tension.:  (Status: improved).  Problems Addressed  Anxiety  Goals 1. Stabilize anxiety level while increasing ability to function on a daily  basis. Objective Learn and implement calming skills to reduce overall anxiety and manage anxiety symptoms. Target Date: 2023-03-05 Frequency: Monthly Progress: 60 Modality: individual Related Interventions 1. Teach the client calming skills (e.g., progressive muscle relaxation, guided imagery, slow  diaphragmatic breathing) and how to discriminate better between relaxation and tension; teach  the client how to apply these skills to his/her daily life. Objective Identify, challenge, and replace fearful self-talk with positive, realistic, and empowering self-talk. Target Date: 2023-03-05 Frequency: Monthly Progress: 60 Modality: individual Related Interventions 1. Explore the client's  schema and self-talk that mediate his/her fear response; challenge the  biases; assist him/her in replacing the distorted messages with reality-based alternatives and  positive self-talk  that will increase his/her self-confidence in coping with irrational fears or  worries. Objective Describe current and past experiences with specific fears, prominent worries, and anxiety symptoms,  including their impact on functioning and attempts to resolve them. Target Date: 2023-03-05 Frequency: Monthly Progress: 60 Modality: individual Related Interventions 1. Assess the focus, excessiveness, and uncontrollability of the client's fears and worries, and the  type, frequency, intensity, and duration of his/her anxiety symptoms (e.g., use The Anxiety  Disorders Interview Schedule - Parent Version or Child Version; consider assigning "Finding  and Losing Your Anxiety" and/or "What Makes Me Anxious" in the Adolescent Psychotherapy  Homework Planner by Baird Cancer, and McInnis). 2. Actively build the level of trust with the client through consistent eye contact, active listening,  unconditional positive regard, and warm acceptance to help increase his/her ability to identify  and express concerns. Objective Verbalize an understanding of how thoughts, physical feelings, and behavioral actions contribute to  anxiety and its treatment. Target Date: 2023-03-05 Frequency: Monthly Progress: 60 Modality: individual  Diagnosis  F41.1 Generalized Anxiety  Disorder  Conditions For Discharge Achievement of treatment goals and objectives.  The patient approved this plan and will be seen monthly.  Marquel Spoto G Trisa Cranor, LCSW                                                                                                        Clotilde Loth G Charrie Mcconnon, LCSW

## 2022-10-29 ENCOUNTER — Ambulatory Visit: Payer: Commercial Managed Care - PPO | Admitting: Psychology

## 2022-11-17 ENCOUNTER — Other Ambulatory Visit (HOSPITAL_COMMUNITY): Payer: Self-pay

## 2022-11-17 DIAGNOSIS — L810 Postinflammatory hyperpigmentation: Secondary | ICD-10-CM | POA: Diagnosis not present

## 2022-11-17 DIAGNOSIS — L700 Acne vulgaris: Secondary | ICD-10-CM | POA: Diagnosis not present

## 2022-11-17 MED ORDER — TRETINOIN 0.05 % EX CREA
TOPICAL_CREAM | CUTANEOUS | 6 refills | Status: AC
  Filled 2022-11-17: qty 20, 30d supply, fill #0

## 2022-11-17 MED ORDER — SPIRONOLACTONE 50 MG PO TABS
ORAL_TABLET | ORAL | 3 refills | Status: DC
  Filled 2022-11-17: qty 42, 28d supply, fill #0
  Filled 2022-12-07: qty 60, 30d supply, fill #0

## 2022-11-17 MED ORDER — DOXYCYCLINE HYCLATE 100 MG PO CAPS
100.0000 mg | ORAL_CAPSULE | Freq: Two times a day (BID) | ORAL | 2 refills | Status: DC
  Filled 2022-11-17: qty 60, 30d supply, fill #0

## 2022-11-19 ENCOUNTER — Other Ambulatory Visit: Payer: Self-pay

## 2022-12-07 ENCOUNTER — Other Ambulatory Visit: Payer: Self-pay

## 2022-12-28 ENCOUNTER — Other Ambulatory Visit (HOSPITAL_COMMUNITY): Payer: Self-pay

## 2022-12-29 ENCOUNTER — Ambulatory Visit (INDEPENDENT_AMBULATORY_CARE_PROVIDER_SITE_OTHER): Payer: Commercial Managed Care - PPO | Admitting: Psychology

## 2022-12-29 DIAGNOSIS — F411 Generalized anxiety disorder: Secondary | ICD-10-CM | POA: Diagnosis not present

## 2022-12-30 NOTE — Progress Notes (Signed)
[facility] Counselor/Therapist Progress Note  Patient ID: Sarah Herrera, MRN: [id number],    Date: 12/29/2022  Time Spent: 60 minutes  Treatment Type: Individual Therapy  Reported Symptoms: anxiety, worry  Mental Status Exam: Appearance:  Casual     Behavior: Appropriate  Motor: normal  Speech/Language:  Normal Rate  Affect: Appropriate  Mood: anxious  Thought process: normal  Thought content:   WNL  Sensory/Perceptual disturbances:   WNL  Orientation: oriented to person, place, time/date, and situation  Attention: Good  Concentration: Good  Memory: WNL  Fund of knowledge:  Good  Insight:   Good  Judgment:  Good  Impulse Control: Good   Risk Assessment: Danger to Self:  No Self-injurious Behavior: No Danger to Others: No Duty to Warn:no Physical Aggression / Violence:No  Access to Firearms a concern: No  Gang Involvement:No   Subjective: The patient was seen today for an individual  therapy session via video visit.  The patient gave verbal consent for the session to be a video on WebEx.  The patient was inside alone and therapist was in the office.  The patient reports that her semester is about over and she is taking finals right now.  She states that her anxiety has been okay and she has not really had very many problems with that except when she has had to interact with her father and his family.  We processed how she is handling those kinds of things and how she can set better limits now that she is out on her own essentially now.  She told me about some instances where the day were not as supportive as they could have been.  She does have good support from her mother which seems to help her tremendously.  She also has a significant other and that seems to be going relatively well.  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  We  processed how she can continue to maintain relationships with her father's family but also have limits and boundaries around that.  The patient will call to schedule an appointment likely prior to going back to school in the fall. Interventions: Cognitive Behavioral Therapy, Assertiveness/Communication, and Insight-Oriented  Diagnosis:Generalized anxiety disorder  Plan: Client Abilities/Strengths  Intelligent, insightful, family support  Client Treatment Preferences  Outpatient Individual therapy  Client Statement of Needs  "I need to learn to manage my stress and anxiety"  Treatment Level  Outpatient Individual therapy  Symptoms  Excessive anxiety, worry, or fear that markedly exceeds the normal level for the client's stage of  development.:  (Status: improved). High level of motor tension, such as  restlessness, tiredness, shakiness, or muscle tension.:  (Status: improved).  Problems Addressed  Anxiety  Goals 1. Stabilize anxiety level while increasing ability to function on a daily  basis. Objective Learn and implement calming skills to reduce overall anxiety and manage anxiety symptoms. Target Date: 2023-03-05 Frequency: Monthly Progress: 60 Modality: individual Related Interventions 1. Teach the client calming skills (e.g., progressive muscle relaxation, guided imagery, slow  diaphragmatic breathing) and how to discriminate better between relaxation and tension; teach  the client how to apply these skills to his/her daily life. Objective Identify, challenge, and replace fearful self-talk with positive, realistic, and empowering self-talk. Target Date: 2023-03-05 Frequency: Monthly Progress: 60 Modality: individual Related Interventions 1. Explore the client's schema and self-talk  that mediate his/her fear response; challenge the  biases; assist him/her in replacing the distorted messages with reality-based alternatives and  positive self-talk that will increase his/her  self-confidence in coping with irrational fears or  worries. Objective Describe current and past experiences with specific fears, prominent worries, and anxiety symptoms,  including their impact on functioning and attempts to resolve them. Target Date: 2023-03-05 Frequency: Monthly Progress: 60 Modality: individual Related Interventions 1. Assess the focus, excessiveness, and uncontrollability of the client's fears and worries, and the  type, frequency, intensity, and duration of his/her anxiety symptoms (e.g., use The Anxiety  Disorders Interview Schedule - Parent Version or Child Version; consider assigning "Finding  and Losing Your Anxiety" and/or "What Makes Me Anxious" in the Adolescent Psychotherapy  Homework Planner by Christoval Blas, and McInnis). 2. Actively build the level of trust with the client through consistent eye contact, active listening,  unconditional positive regard, and warm acceptance to help increase his/her ability to identify  and express concerns. Objective Verbalize an understanding of how thoughts, physical feelings, and behavioral actions contribute to  anxiety and its treatment. Target Date: 2023-03-05 Frequency: Monthly Progress: 60 Modality: individual  Diagnosis  F41.1 Generalized Anxiety  Disorder  Conditions For Discharge Achievement of treatment goals and objectives.  The patient approved this plan and will be seen monthly.  Ersa Delaney G Juandiego Kolenovic, LCSW

## 2023-02-11 ENCOUNTER — Other Ambulatory Visit: Payer: Self-pay

## 2023-02-14 ENCOUNTER — Other Ambulatory Visit (HOSPITAL_COMMUNITY): Payer: Self-pay

## 2023-03-04 ENCOUNTER — Telehealth: Payer: Self-pay

## 2023-03-04 NOTE — Telephone Encounter (Signed)
LVM for patient to call back [phone], or to call PCP office to schedule follow up apt. AS, CMA  

## 2023-04-18 ENCOUNTER — Other Ambulatory Visit (HOSPITAL_BASED_OUTPATIENT_CLINIC_OR_DEPARTMENT_OTHER): Payer: Self-pay

## 2023-04-18 ENCOUNTER — Other Ambulatory Visit (HOSPITAL_COMMUNITY): Payer: Self-pay

## 2023-04-18 DIAGNOSIS — L700 Acne vulgaris: Secondary | ICD-10-CM | POA: Diagnosis not present

## 2023-04-18 DIAGNOSIS — L710 Perioral dermatitis: Secondary | ICD-10-CM | POA: Diagnosis not present

## 2023-04-18 MED ORDER — MINOCYCLINE HCL 100 MG PO CAPS
100.0000 mg | ORAL_CAPSULE | Freq: Two times a day (BID) | ORAL | 2 refills | Status: DC
  Filled 2023-04-18 (×2): qty 60, 30d supply, fill #0

## 2023-04-18 MED ORDER — TRIAMCINOLONE ACETONIDE 0.025 % EX CREA
TOPICAL_CREAM | CUTANEOUS | 1 refills | Status: DC
  Filled 2023-04-18: qty 80, 28d supply, fill #0
  Filled 2023-04-18 (×3): qty 80, 30d supply, fill #0

## 2023-04-22 ENCOUNTER — Ambulatory Visit: Payer: Commercial Managed Care - PPO | Admitting: Psychology

## 2023-04-22 DIAGNOSIS — F411 Generalized anxiety disorder: Secondary | ICD-10-CM

## 2023-04-22 NOTE — Progress Notes (Signed)
[facility] Counselor/Therapist Progress Note  Patient ID: Sarah Herrera, MRN: [id number],    Date: 04/22/2023  Time Spent: 60 minutes  Time in:  1:00  Time out:  2:00  Treatment Type: Individual Therapy  Reported Symptoms: anxiety, worry  Mental Status Exam: Appearance:  Casual     Behavior: Appropriate  Motor: normal  Speech/Language:  Normal Rate  Affect: Appropriate  Mood: anxious  Thought process: normal  Thought content:   WNL  Sensory/Perceptual disturbances:   WNL  Orientation: oriented to person, place, time/date, and situation  Attention: Good  Concentration: Good  Memory: WNL  Fund of knowledge:  Good  Insight:   Good  Judgment:  Good  Impulse Control: Good   Risk Assessment: Danger to Self:  No Self-injurious Behavior: No Danger to Others: No Duty to Warn:no Physical Aggression / Violence:No  Access to Firearms a concern: No  Gang Involvement:No   Subjective: The patient was seen today for an individual  therapy session via video visit.  The patient gave verbal consent for the session to be a video on Caregility and she is aware of limitations of telehealth. The patient was inside alone and therapist was in the office.  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  The patient also just got back to school and she talked about the stress related to that as well as trying to resume the relationship that she had with her boyfriend or to going to her internship.  Apparently there was some issues because of the distance with that relationship and we talked about how they have reconnected and encouraged her to make sure that he is treating her the way that she wants to be treated.  In addition some of the same issues around her parents has presented itself because she is going back to school and ended up spending some time at home and they did not meet  her expectations when she was home.  We talked about this and recommend I recommended that she change her expectations of them and not have the expectation that they are going to respond the way she wants them to.    Interventions: Cognitive Behavioral Therapy, Assertiveness/Communication, and Insight-Oriented  Diagnosis:Generalized anxiety disorder  Plan: Client Abilities/Strengths  Intelligent, insightful, family support  Client Treatment Preferences  Outpatient Individual therapy  Client Statement of Needs  "I need to learn to manage my stress and anxiety"  Treatment Level  Outpatient Individual therapy  Symptoms  Excessive anxiety, worry, or fear that markedly exceeds the normal level for the client's stage of  development.:  (Status: improved). High level of motor tension, such as  restlessness, tiredness, shakiness, or muscle tension.:  (Status: improved).  Problems Addressed  Anxiety  Goals 1. Stabilize anxiety level while increasing ability to function on a daily  basis. Objective Learn and implement calming skills to reduce overall anxiety and manage anxiety symptoms. Target Date: 2024-03-04 Frequency: Monthly Progress: 60 Modality: individual Related Interventions 1. Teach the client calming skills (e.g., progressive muscle relaxation, guided imagery, slow  diaphragmatic breathing) and how to discriminate better between relaxation and tension; teach  the client how to apply these skills to his/her daily life. Objective Identify, challenge, and replace fearful self-talk with positive, realistic, and empowering self-talk. Target Date: 2024-03-04 Frequency: Monthly Progress: 60 Modality: individual Related Interventions 1. Explore the client's schema and self-talk that mediate  his/her fear response; challenge the  biases; assist him/her in replacing the distorted messages with reality-based alternatives and  positive self-talk that will increase his/her self-confidence in  coping with irrational fears or  worries. Objective Describe current and past experiences with specific fears, prominent worries, and anxiety symptoms,  including their impact on functioning and attempts to resolve them. Target Date: 2024-03-04 Frequency: Monthly Progress: 60 Modality: individual Related Interventions 1. Assess the focus, excessiveness, and uncontrollability of the client's fears and worries, and the  type, frequency, intensity, and duration of his/her anxiety symptoms (e.g., use The Anxiety  Disorders Interview Schedule - Parent Version or Child Version; consider assigning "Finding  and Losing Your Anxiety" and/or "What Makes Me Anxious" in the Adolescent Psychotherapy  Homework Planner by [place] Blas, and McInnis). 2. Actively build the level of trust with the client through consistent eye contact, active listening,  unconditional positive regard, and warm acceptance to help increase his/her ability to identify  and express concerns. Objective Verbalize an understanding of how thoughts, physical feelings, and behavioral actions contribute to  anxiety and its treatment. Target Date: 2024-03-04 Frequency: Monthly Progress: 60 Modality: individual  Diagnosis  F41.1 Generalized Anxiety  Disorder  Conditions For Discharge Achievement of treatment goals and objectives.  The patient approved this plan and will be seen monthly.  Prescious Hurless G Osceola Holian, LCSW

## 2023-05-04 ENCOUNTER — Other Ambulatory Visit: Payer: Self-pay

## 2023-06-21 ENCOUNTER — Ambulatory Visit (INDEPENDENT_AMBULATORY_CARE_PROVIDER_SITE_OTHER): Payer: Commercial Managed Care - PPO | Admitting: Psychology

## 2023-06-21 DIAGNOSIS — F411 Generalized anxiety disorder: Secondary | ICD-10-CM | POA: Diagnosis not present

## 2023-06-21 NOTE — Progress Notes (Signed)
[facility] Counselor/Therapist Progress Note  Patient ID: Sarah Herrera, MRN: [id number],    Date: 06/21/2023  Time Spent: 59 minutes  Time in:  1:00  Time out:  1:59  Treatment Type: Individual Therapy  Reported Symptoms: anxiety, worry  Mental Status Exam: Appearance:  Casual     Behavior: Appropriate  Motor: normal  Speech/Language:  Normal Rate  Affect: Appropriate  Mood: pleasant  Thought process: normal  Thought content:   WNL  Sensory/Perceptual disturbances:   WNL  Orientation: oriented to person, place, time/date, and situation  Attention: Good  Concentration: Good  Memory: WNL  Fund of knowledge:  Good  Insight:   Good  Judgment:  Good  Impulse Control: Good   Risk Assessment: Danger to Self:  No Self-injurious Behavior: No Danger to Others: No Duty to Warn:no Physical Aggression / Violence:No  Access to Firearms a concern: No  Gang Involvement:No   Subjective: The patient was seen today for an individual  therapy session via video visit.  The patient gave verbal consent for the session to be a video on Caregility and she is aware of limitations of telehealth. The patient was inside alone and therapist was in the office.  The patient reports that things have been going relatively well.  She talked about her class that she is struggling with as a little and we talked about a different way for her to proceed that class.  I explained to her that if she passes the class she will graduate just like everybody else whether if she makes a C or whether she makes an A.  She agreed that she would try to look at that differently.  We also talked about her family dynamics and she seems to be handling that relatively well.  I talked with her about how to look at things with her mom differently and she was open to making a change that we had discussed.  We decided that the patient would call me if needed and I could get her in relatively quickly if needed.  She  is doing very well and she is only to call back if she needs to be seen.  Interventions: Cognitive Behavioral Therapy, Assertiveness/Communication, and Insight-Oriented  Diagnosis:Generalized anxiety disorder  Plan: Client Abilities/Strengths  Intelligent, insightful, family support  Client Treatment Preferences  Outpatient Individual therapy  Client Statement of Needs  "I need to learn to manage my stress and anxiety"  Treatment Level  Outpatient Individual therapy  Symptoms  Excessive anxiety, worry, or fear that markedly exceeds the normal level for the client's stage of  development.:  (Status: improved). High level of motor tension, such as  restlessness, tiredness, shakiness, or muscle tension.:  (Status: improved).  Problems Addressed  Anxiety  Goals 1. Stabilize anxiety level while increasing ability to function on a daily  basis. Objective Learn and implement calming skills to reduce overall anxiety and manage anxiety symptoms. Target Date: 2024-03-04 Frequency: Monthly Progress: 70 Modality: individual Related Interventions 1. Teach the client calming skills (e.g., progressive muscle relaxation, guided imagery, slow  diaphragmatic breathing) and how to discriminate better between relaxation and tension; teach  the client how to apply these skills to his/her daily life. Objective Identify, challenge, and replace fearful self-talk with positive, realistic, and empowering self-talk. Target Date: 2024-03-04 Frequency: Monthly Progress: 70 Modality: individual Related Interventions 1. Explore the client's schema and self-talk that mediate his/her fear response; challenge the  biases; assist him/her in replacing the distorted messages with  reality-based alternatives and  positive self-talk that will increase his/her self-confidence in coping with irrational fears or  worries. Objective Describe current and past experiences with specific fears, prominent worries, and  anxiety symptoms,  including their impact on functioning and attempts to resolve them. Target Date: 2024-03-04 Frequency: Monthly Progress: 70 Modality: individual Related Interventions 1. Assess the focus, excessiveness, and uncontrollability of the client's fears and worries, and the  type, frequency, intensity, and duration of his/her anxiety symptoms (e.g., use The Anxiety  Disorders Interview Schedule - Parent Version or Child Version; consider assigning "Finding  and Losing Your Anxiety" and/or "What Makes Me Anxious" in the Adolescent Psychotherapy  Homework Planner by [place] Blas, and McInnis). 2. Actively build the level of trust with the client through consistent eye contact, active listening,  unconditional positive regard, and warm acceptance to help increase his/her ability to identify  and express concerns. Objective Verbalize an understanding of how thoughts, physical feelings, and behavioral actions contribute to  anxiety and its treatment. Target Date: 2024-03-04 Frequency: Monthly Progress: 70 Modality: individual  Diagnosis  F41.1 Generalized Anxiety  Disorder  Conditions For Discharge Achievement of treatment goals and objectives.  The patient approved this plan and will be seen monthly.  Saloni Lablanc G Melis Trochez, LCSW

## 2023-06-24 DIAGNOSIS — Z23 Encounter for immunization: Secondary | ICD-10-CM | POA: Diagnosis not present

## 2023-07-14 ENCOUNTER — Other Ambulatory Visit: Payer: Self-pay | Admitting: Family Medicine

## 2023-07-14 DIAGNOSIS — Z30011 Encounter for initial prescription of contraceptive pills: Secondary | ICD-10-CM

## 2023-07-14 DIAGNOSIS — N946 Dysmenorrhea, unspecified: Secondary | ICD-10-CM

## 2023-07-15 ENCOUNTER — Other Ambulatory Visit: Payer: Self-pay

## 2023-07-15 ENCOUNTER — Other Ambulatory Visit (HOSPITAL_COMMUNITY): Payer: Self-pay

## 2023-07-15 MED ORDER — NORETHINDRONE 0.35 MG PO TABS
1.0000 | ORAL_TABLET | Freq: Every day | ORAL | 1 refills | Status: AC
  Filled 2023-07-15: qty 84, 84d supply, fill #0
  Filled 2023-10-01: qty 84, 84d supply, fill #1

## 2023-08-26 ENCOUNTER — Ambulatory Visit: Payer: Commercial Managed Care - PPO | Admitting: Psychology

## 2023-08-26 DIAGNOSIS — F411 Generalized anxiety disorder: Secondary | ICD-10-CM | POA: Diagnosis not present

## 2023-08-26 NOTE — Progress Notes (Unsigned)
                Sarah Herrera G Denario Bagot, LCSW 

## 2023-11-08 ENCOUNTER — Ambulatory Visit: Payer: Commercial Managed Care - PPO | Admitting: Psychology

## 2023-11-08 DIAGNOSIS — F411 Generalized anxiety disorder: Secondary | ICD-10-CM

## 2023-11-08 NOTE — Progress Notes (Signed)
 [facility] Counselor/Therapist Progress Note  Patient ID: Sarah Herrera, MRN: [id number],    Date: 11/08/2023  Time Spent: 57 minutes  Time in:  9:03  Time out:  10:00  Treatment Type: Individual Therapy  Reported Symptoms: anxiety, worry  Mental Status Exam: Appearance:  Casual     Behavior: Appropriate  Motor: normal  Speech/Language:  Normal Rate  Affect: Appropriate  Mood: pleasant  Thought process: normal  Thought content:   WNL  Sensory/Perceptual disturbances:   WNL  Orientation: oriented to person, place, time/date, and situation  Attention: Good  Concentration: Good  Memory: WNL  Fund of knowledge:  Good  Insight:   Good  Judgment:  Good  Impulse Control: Good   Risk Assessment: Danger to Self:  No Self-injurious Behavior: No Danger to Others: No Duty to Warn:no Physical Aggression / Violence:No  Access to Firearms a concern: No  Gang Involvement:No   Subjective: The patient was seen today for an individual  therapy session via video visit.  The patient gave verbal consent for the session to be a video on Caregility and she is aware of limitations of telehealth. The patient was home alone and therapist was in the office.  The patient presents as a little anxious today.  The patient reports that she had some issues with some of her classes and we talked about her being somewhat overwhelmed.  She seems to handle things better when she is at school than she does when she is at home.  The patient talked about struggling with taking on too much this semester of extracurricular activities and we talked about how she can prioritize things and also talked about how she can delegate things to other people so that she is not trying to micromanage the situation.  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  We talked about looking at  it as if it is a snow globe and not going in and reaching and pulling out the things that she empathizes with.  We also talked about her managing her time better and spacing things out so that she can get through this semester.  We scheduled another appointment in May.  Interventions: Cognitive Behavioral Therapy, Assertiveness/Communication, and Insight-Oriented  Diagnosis:Generalized anxiety disorder  Plan: Client Abilities/Strengths  Intelligent, insightful, family support  Client Treatment Preferences  Outpatient Individual therapy  Client Statement of Needs  "I need to learn to manage my stress and anxiety"  Treatment Level  Outpatient Individual therapy  Symptoms  Excessive anxiety, worry, or fear that markedly exceeds the normal level for the client's stage of  development.:  (Status: improved). High level of motor tension, such as  restlessness, tiredness, shakiness, or muscle tension.:  (Status: improved).  Problems Addressed  Anxiety  Goals 1. Stabilize anxiety level while increasing ability to function on a daily  basis. Objective Learn and implement calming skills to reduce overall anxiety and manage anxiety symptoms. Target Date: 2024-03-04 Frequency: Monthly Progress: 70 Modality: individual Related Interventions 1. Teach the client calming skills (e.g., progressive muscle relaxation, guided imagery, slow  diaphragmatic breathing) and how to discriminate better between relaxation and tension; teach  the client how to apply these skills to his/her daily life. Objective Identify, challenge, and replace fearful self-talk with positive, realistic, and empowering self-talk. Target Date: 2024-03-04 Frequency: Monthly Progress: 70 Modality: individual Related Interventions  1. Explore the client's schema and self-talk that mediate his/her fear response; challenge the  biases; assist him/her in replacing the distorted messages with reality-based alternatives and  positive  self-talk that will increase his/her self-confidence in coping with irrational fears or  worries. Objective Describe current and past experiences with specific fears, prominent worries, and anxiety symptoms,  including their impact on functioning and attempts to resolve them. Target Date: 2024-03-04 Frequency: Monthly Progress: 70 Modality: individual Related Interventions 1. Assess the focus, excessiveness, and uncontrollability of the client's fears and worries, and the  type, frequency, intensity, and duration of his/her anxiety symptoms (e.g., use The Anxiety  Disorders Interview Schedule - Parent Version or Child Version; consider assigning "Finding  and Losing Your Anxiety" and/or "What Makes Me Anxious" in the Adolescent Psychotherapy  Homework Planner by [place] Blas, and McInnis). 2. Actively build the level of trust with the client through consistent eye contact, active listening,  unconditional positive regard, and warm acceptance to help increase his/her ability to identify  and express concerns. Objective Verbalize an understanding of how thoughts, physical feelings, and behavioral actions contribute to  anxiety and its treatment. Target Date: 2024-03-04 Frequency: Monthly Progress: 70 Modality: individual  Diagnosis  F41.1 Generalized Anxiety  Disorder  Conditions For Discharge Achievement of treatment goals and objectives.  The patient approved this plan and will be seen monthly.  Omaira Mellen G Pierrette Scheu, LCSW                                                                                                                   Leesa Leifheit G Miri Jose, LCSW

## 2023-11-30 DIAGNOSIS — R040 Epistaxis: Secondary | ICD-10-CM | POA: Diagnosis not present

## 2023-12-13 ENCOUNTER — Encounter: Payer: Self-pay | Admitting: Family Medicine

## 2023-12-13 ENCOUNTER — Telehealth: Payer: Self-pay | Admitting: Family Medicine

## 2023-12-13 NOTE — Telephone Encounter (Signed)
 Copied from CRM [id number]. Topic: Referral - Status >> Dec 13, 2023  3:05 PM Sim Boast F wrote: Reason for CRM: [facility] to let Dr[facility] [place] know that they will be be calling the patient today to get her scheduled (Referring to MyChart message from patient today 4/8)

## 2024-01-05 ENCOUNTER — Inpatient Hospital Stay

## 2024-01-05 ENCOUNTER — Inpatient Hospital Stay: Attending: Hematology and Oncology | Admitting: Hematology and Oncology

## 2024-01-05 VITALS — BP 136/74 | HR 79 | Temp 98.1°F | Resp 18 | Ht 67.0 in | Wt 140.0 lb

## 2024-01-05 DIAGNOSIS — R040 Epistaxis: Secondary | ICD-10-CM | POA: Insufficient documentation

## 2024-01-05 LAB — CBC WITH DIFFERENTIAL/PLATELET
Abs Immature Granulocytes: 0.01 10*3/uL (ref 0.00–0.07)
Basophils Absolute: 0.1 10*3/uL (ref 0.0–0.1)
Basophils Relative: 1 %
Eosinophils Absolute: 0.1 10*3/uL (ref 0.0–0.5)
Eosinophils Relative: 2 %
HCT: 38.2 % (ref 36.0–46.0)
Hemoglobin: 13.5 g/dL (ref 12.0–15.0)
Immature Granulocytes: 0 %
Lymphocytes Relative: 31 %
Lymphs Abs: 2.9 10*3/uL (ref 0.7–4.0)
MCH: 30.2 pg (ref 26.0–34.0)
MCHC: 35.3 g/dL (ref 30.0–36.0)
MCV: 85.5 fL (ref 80.0–100.0)
Monocytes Absolute: 0.8 10*3/uL (ref 0.1–1.0)
Monocytes Relative: 8 %
Neutro Abs: 5.7 10*3/uL (ref 1.7–7.7)
Neutrophils Relative %: 58 %
Platelets: 272 10*3/uL (ref 150–400)
RBC: 4.47 MIL/uL (ref 3.87–5.11)
RDW: 12.8 % (ref 11.5–15.5)
WBC: 9.5 10*3/uL (ref 4.0–10.5)
nRBC: 0 % (ref 0.0–0.2)

## 2024-01-05 LAB — CMP (CANCER CENTER ONLY)
ALT: 9 U/L (ref 0–44)
AST: 19 U/L (ref 15–41)
Albumin: 5.2 g/dL — ABNORMAL HIGH (ref 3.5–5.0)
Alkaline Phosphatase: 53 U/L (ref 38–126)
Anion gap: 7 (ref 5–15)
BUN: 13 mg/dL (ref 6–20)
CO2: 26 mmol/L (ref 22–32)
Calcium: 9.7 mg/dL (ref 8.9–10.3)
Chloride: 105 mmol/L (ref 98–111)
Creatinine: 0.89 mg/dL (ref 0.44–1.00)
GFR, Estimated: >60 mL/min (ref >60)
Glucose, Bld: 95 mg/dL (ref 70–99)
Potassium: 3.7 mmol/L (ref 3.5–5.1)
Sodium: 138 mmol/L (ref 135–145)
Total Bilirubin: 0.4 mg/dL (ref 0.0–1.2)
Total Protein: 8.2 g/dL — ABNORMAL HIGH (ref 6.5–8.1)

## 2024-01-05 LAB — PLATELET FUNCTION ASSAY: Collagen / Epinephrine: 106 s (ref 0–193)

## 2024-01-05 LAB — PROTIME-INR
INR: 1 (ref 0.8–1.2)
Prothrombin Time: 13.1 s (ref 11.4–15.2)

## 2024-01-05 LAB — APTT: aPTT: 28 s (ref 24–36)

## 2024-01-05 NOTE — Assessment & Plan Note (Signed)
 Causes of recurrent nosebleeds: Dryness of the air Frequent nose picking or blowing Allergies or colds Use of nasal sprays Trauma Hypertension Blood clotting disorders Medications etc.  Workup recommended: CBC with differential, PT PTT INR, von Willebrand factor and factor VIII If the above results are negative then we will consider platelet aggregation studies and further evaluate for delta granule storage pool disorders

## 2024-01-05 NOTE — Progress Notes (Signed)
 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  She has always had nosebleeds since she was in fourth grade but lately it has been longer and more pronounced.  She was seen at the student health clinic at [facility] who performed blood work that showed prolongation of PT/INR but normal PTT.  She was referred to us  for further evaluation.  She was also been referred to ENT for evaluation.  She is accompanied today by her mom.   I reviewed her records extensively and collaborated the history with the patient.  SUMMARY OF ONCOLOGIC HISTORY: Oncology History   No history exists.     MEDICAL HISTORY:  Past Medical History:  Diagnosis Date   Acne    Allergy    Heart murmur    as an infant   Mouth ulcers     SURGICAL HISTORY: No past surgical history on file.  SOCIAL HISTORY: Social History   Socioeconomic History   Marital status: Single    Spouse name: Not on file   Number of children: Not on file   Years of education: Not on file   Highest education level: 12th grade  Occupational History   Not on file  Tobacco Use   Smoking status: Never   Smokeless tobacco: Never  Vaping Use   Vaping status: Never Used  Substance and Sexual Activity   Alcohol use: Never   Drug use: Never   Sexual activity: Not on file  Other Topics Concern   Not on file  Social History Narrative   Not on file   Social Drivers of Health   Financial Resource Strain: Low Risk  (08/20/2022)   Overall Financial Resource Strain (CARDIA)    Difficulty of Paying Living Expenses: Not hard at all  Food Insecurity: No Food Insecurity (08/20/2022)   Hunger Vital  Sign    Worried About Running Out of Food in the Last Year: Never true    Ran Out of Food in the Last Year: Never true  Transportation Needs: No Transportation Needs (08/20/2022)   PRAPARE - Administrator, Civil Service (Medical): No    Lack of Transportation (Non-Medical): No  Physical Activity: Sufficiently Active (08/20/2022)   Exercise Vital Sign    Days of Exercise per Week: 7 days    Minutes of Exercise per Session: 60 min  Stress: No Stress Concern Present (08/20/2022)   [employer] of Occupational Health - Occupational Stress Questionnaire    Feeling of Stress : Only a little  Social Connections: Moderately Integrated (08/20/2022)   Social Connection and Isolation Panel [NHANES]    Frequency of Communication with Friends and Family: More than three times a week    Frequency of Social Gatherings with Friends and Family: More than three times a week    Attends Religious Services: More than 4 times per year    Active Member of [employer] or Organizations: Yes    Attends Engineer, structural: More than 4 times per year    Marital Status: Never married  Catering manager Violence: Not on file    FAMILY HISTORY: Family History  Problem Relation Age of Onset   Learning disabilities Mother    Hypertension Mother  Miscarriages / [place] Mother    Miscarriages / Stillbirths Maternal Grandmother    Hypertension Maternal Grandmother    Diabetes Maternal Grandmother    Cancer Maternal Grandmother    Hypertension Maternal Grandfather    Cancer Maternal Grandfather    Learning disabilities Half-Brother    Stroke Half-Brother     ALLERGIES:  has no known allergies.  MEDICATIONS:  Current Outpatient Medications  Medication Sig Dispense Refill   doxycycline  (VIBRAMYCIN ) 100 MG capsule Take 1 capsule (100 mg total) by mouth 2 (two) times daily with food (Avoid laying flat for 1-2 hours after taking. Can make you more sun sensitive) 60 capsule 2    minocycline  (MINOCIN ) 100 MG capsule Take 1 capsule (100 mg) by mouth 2 times daily. 60 capsule 2   norethindrone  (INCASSIA ) 0.35 MG tablet Take 1 tablet by mouth daily. 84 tablet 1   omeprazole  (PRILOSEC) 40 MG capsule Take 1 capsule by mouth daily. 30 capsule 1   spironolactone  (ALDACTONE ) 50 MG tablet Take 1 tablet (50 mg total) by mouth daily for 2 weeks then increase to 2 tablets daily 60 tablet 3   spironolactone  (ALDACTONE ) 50 MG tablet Take 1 tablet (50 mg total) by mouth daily for 14 days, THEN 2 tablets (100 mg total) daily. 60 tablet 3   tretinoin  (RETIN-A ) 0.025 % cream Apply pea sized amount to face nightly 45 g 2   tretinoin  (RETIN-A ) 0.05 % cream Apply a pea size amount to the entire face nightly as tolerated 20 g 6   triamcinolone  (KENALOG ) 0.025 % cream Apply to affected area twice daily for 2 - 4 weeks, not safe for long term use 80 g 1   No current facility-administered medications for this visit.    REVIEW OF SYSTEMS:   Constitutional: Denies fevers, chills or abnormal night sweats Frequent nosebleeds All other systems were reviewed with the patient and are negative.  PHYSICAL EXAMINATION: ECOG PERFORMANCE STATUS: 0 - Asymptomatic  Vitals:   01/05/24 1437  BP: 136/74  Pulse: 79  Resp: 18  Temp: 98.1 F (36.7 C)  SpO2: 100%   Filed Weights   01/05/24 1437  Weight: 140 lb (63.5 kg)    GENERAL:alert, no distress and comfortable    LABORATORY DATA:  I have reviewed the data as listed Lab Results  Component Value Date   WBC 5.1 07/15/2021   HGB 13.2 07/15/2021   HCT 38.7 07/15/2021   MCV 87.9 07/15/2021   PLT 214.0 07/15/2021   Lab Results  Component Value Date   NA 138 07/15/2021   K 3.9 07/15/2021   CL 104 07/15/2021   CO2 26 07/15/2021    RADIOGRAPHIC STUDIES: I have personally reviewed the radiological reports and agreed with the findings in the report.  ASSESSMENT AND PLAN:  Recurrent epistaxis Causes of recurrent nosebleeds: Dryness of  the air Frequent nose picking or blowing Allergies or colds Use of nasal sprays Trauma Hypertension Blood clotting disorders Medications etc.  Workup recommended: CBC with differential, PT PTT INR, von Willebrand factor and factor VIII, CMP and factor VII (previous lab work showed prolonged PTT INR: 1.3 and normal PTT) platelet aggregation studies and further evaluate for delta granule storage pool disorders  Telephone visit in 1 week to discuss results  All questions were answered. The patient knows to call the clinic with any problems, questions or concerns.    Viinay K Orah Sonnen, MD 01/05/24

## 2024-01-06 ENCOUNTER — Ambulatory Visit: Admitting: Psychology

## 2024-01-06 ENCOUNTER — Telehealth: Payer: Self-pay | Admitting: Hematology and Oncology

## 2024-01-06 NOTE — Telephone Encounter (Signed)
 Confirmed with pt scheduled telephone appt date and time

## 2024-01-07 LAB — FACTOR 7 ASSAY: Factor VII Activity: 148 % (ref 51–186)

## 2024-01-07 LAB — FACTOR 8 ASSAY: Coagulation Factor VIII: 128 % (ref 56–140)

## 2024-01-10 ENCOUNTER — Encounter (INDEPENDENT_AMBULATORY_CARE_PROVIDER_SITE_OTHER): Payer: Self-pay

## 2024-01-10 ENCOUNTER — Encounter (HOSPITAL_BASED_OUTPATIENT_CLINIC_OR_DEPARTMENT_OTHER): Payer: Self-pay | Admitting: Certified Nurse Midwife

## 2024-01-10 ENCOUNTER — Ambulatory Visit (INDEPENDENT_AMBULATORY_CARE_PROVIDER_SITE_OTHER): Admitting: Physician Assistant

## 2024-01-10 ENCOUNTER — Encounter (HOSPITAL_BASED_OUTPATIENT_CLINIC_OR_DEPARTMENT_OTHER): Admitting: Obstetrics & Gynecology

## 2024-01-10 ENCOUNTER — Ambulatory Visit (INDEPENDENT_AMBULATORY_CARE_PROVIDER_SITE_OTHER): Admitting: Certified Nurse Midwife

## 2024-01-10 ENCOUNTER — Other Ambulatory Visit (HOSPITAL_COMMUNITY): Payer: Self-pay

## 2024-01-10 VITALS — BP 116/84 | HR 70 | Ht 67.0 in | Wt 140.0 lb

## 2024-01-10 VITALS — BP 121/84 | HR 78 | Ht 67.0 in | Wt 140.2 lb

## 2024-01-10 DIAGNOSIS — R040 Epistaxis: Secondary | ICD-10-CM

## 2024-01-10 DIAGNOSIS — Z30015 Encounter for initial prescription of vaginal ring hormonal contraceptive: Secondary | ICD-10-CM | POA: Diagnosis not present

## 2024-01-10 MED ORDER — ETONOGESTREL-ETHINYL ESTRADIOL 0.12-0.015 MG/24HR VA RING
VAGINAL_RING | VAGINAL | 5 refills | Status: AC
  Filled 2024-01-10: qty 4, 84d supply, fill #0
  Filled 2024-03-20: qty 4, 84d supply, fill #1
  Filled 2024-06-04: qty 4, 84d supply, fill #2
  Filled 2024-09-09: qty 4, 84d supply, fill #3

## 2024-01-10 NOTE — Progress Notes (Unsigned)
  GYNECOLOGY  VISIT  CC:   Sarah Herrera is here to discuss birth control  HPI: 23 y.o. No obstetric history on file. Single White or Caucasian female here to discuss birth control. She is on a pill currently (has taken a birth control pill intermittently over several years), but does not like taking a pill daily. She would like to try the NuvaRing contraceptive vaginal ring. Pt would like to try extended regimen and would like to try to avoid menstrual periods.    Past Medical History:  Diagnosis Date   Acne    Allergy    Heart murmur    as an infant   Mouth ulcers     MEDS:   Current Outpatient Medications on File Prior to Visit  Medication Sig Dispense Refill   norethindrone  (INCASSIA ) 0.35 MG tablet Take 1 tablet by mouth daily. 84 tablet 1   tretinoin  (RETIN-A ) 0.05 % cream Apply a pea size amount to the entire face nightly as tolerated 20 g 6   No current facility-administered medications on file prior to visit.    ALLERGIES: Patient has no known allergies.  SH:  Sexually active, monogamous with 1 partner long-term, good family support  Review of Systems  Constitutional: Negative.   Genitourinary: Negative.   Neurological: Negative.   Endo/Heme/Allergies:        Nosebleeds/cauterization  Psychiatric/Behavioral: Negative.      PHYSICAL EXAMINATION:    BP 121/84 (BP Location: Right Arm, Patient Position: Sitting, Cuff Size: Normal)   Pulse 78   Ht 5\' 7"  (1.702 m)   Wt 140 lb 3.2 oz (63.6 kg)   LMP 12/14/2023   BMI 21.96 kg/m     General appearance: alert, cooperative and appears stated age    Assessment/Plan:  1. Encounter for initial prescription of vaginal ring hormonal contraceptive 01/10/24 (Primary) - Pt aware of all available contraceptive options and would prefer to try contraceptive ring - Pt aware of monthly vs. Extended regimen and she will trial extended regimen - RTO December for annual gyn exam with pap smear for cervical cancer screening - No risk of  STI and no desire for testing  Abe Abed K Sarah Herrera

## 2024-01-10 NOTE — Progress Notes (Signed)
 Dear Dr. Estela Held, Here is my assessment for our mutual patient, Sarah Herrera. Thank you for allowing me the opportunity to care for your patient. Please do not hesitate to contact me should you have any other questions. Sincerely, Belma Boxer PA-C  Otolaryngology Clinic Note Referring provider: Dr. Estela Held HPI:  Sarah Herrera is a 23 y.o. female kindly referred by Dr. Estela Held   The patient is a 23 year old female seen in our office for evaluation of epistaxis.  The patient notes that since she was in fourth grade approximately 10 years ago she started developing right sided epistaxis.  She notes that stress makes this worse.  She notes bleeding out of the right anterior nose, no posterior bleed.  She notes it does resolve with direct pressure.  She was originally seen by ENT who recommended chemical cautery, she did not want to do that.  She notes she gets approximately 3 bleeds per week.  She was seen by oncology with workup for bleeding disorder with no diagnosis of any bleeding issues.  She does note a history of seasonal allergies and uses Zyrtec, she does not use Flonase.  She notes her seasonal allergies are well-controlled.  She does use saline in her nose.  No head or neck trauma, no head or neck surgery.     Independent Review of Additional Tests or Records:  Oncological evaluation on 01/05/2024   PMH/Meds/All/SocHx/FamHx/ROS:   Past Medical History:  Diagnosis Date   Acne    Allergy    Heart murmur    as an infant   Mouth ulcers      History reviewed. No pertinent surgical history.  Family History  Problem Relation Age of Onset   Learning disabilities Mother    Hypertension Mother    Miscarriages / [place] Mother    Miscarriages / Stillbirths Maternal Grandmother    Hypertension Maternal Grandmother    Diabetes Maternal Grandmother    Cancer Maternal Grandmother    Hypertension Maternal Grandfather    Cancer Maternal Grandfather    Learning disabilities  Half-Brother    Stroke Half-Brother      Social Connections: Moderately Integrated (08/20/2022)   Social Connection and Isolation Panel [NHANES]    Frequency of Communication with Friends and Family: More than three times a week    Frequency of Social Gatherings with Friends and Family: More than three times a week    Attends Religious Services: More than 4 times per year    Active Member of Clubs or Organizations: Yes    Attends Engineer, structural: More than 4 times per year    Marital Status: Never married      Current Outpatient Medications:    cetirizine (ZYRTEC) 10 MG tablet, Take 10 mg by mouth daily., Disp: , Rfl:    norethindrone  (INCASSIA ) 0.35 MG tablet, Take 1 tablet by mouth daily., Disp: 84 tablet, Rfl: 1   tretinoin  (RETIN-A ) 0.05 % cream, Apply a pea size amount to the entire face nightly as tolerated, Disp: 20 g, Rfl: 6   doxycycline  (VIBRAMYCIN ) 100 MG capsule, Take 1 capsule (100 mg total) by mouth 2 (two) times daily with food (Avoid laying flat for 1-2 hours after taking. Can make you more sun sensitive), Disp: 60 capsule, Rfl: 2   minocycline  (MINOCIN ) 100 MG capsule, Take 1 capsule (100 mg) by mouth 2 times daily., Disp: 60 capsule, Rfl: 2   omeprazole  (PRILOSEC) 40 MG capsule, Take 1 capsule by mouth daily., Disp: 30 capsule, Rfl: 1  spironolactone  (ALDACTONE ) 50 MG tablet, Take 1 tablet (50 mg total) by mouth daily for 2 weeks then increase to 2 tablets daily, Disp: 60 tablet, Rfl: 3   spironolactone  (ALDACTONE ) 50 MG tablet, Take 1 tablet (50 mg total) by mouth daily for 14 days, THEN 2 tablets (100 mg total) daily., Disp: 60 tablet, Rfl: 3   tretinoin  (RETIN-A ) 0.025 % cream, Apply pea sized amount to face nightly, Disp: 45 g, Rfl: 2   triamcinolone  (KENALOG ) 0.025 % cream, Apply to affected area twice daily for 2 - 4 weeks, not safe for long term use, Disp: 80 g, Rfl: 1   Physical Exam:   BP 116/84 (BP Location: Left Arm, Cuff Size: Normal)   Pulse  70   Ht 5\' 7"  (1.702 m)   Wt 140 lb (63.5 kg)   SpO2 99%   BMI 21.93 kg/m   Pertinent Findings  CN II-XII intact  Bilateral EAC clear and TM intact with well pneumatized middle ear spaces Anterior rhinoscopy: Septum midline; bilateral inferior turbinates with no hypertrophy, right anterior septum with friable tissue and small area of granulation tissue, no active bleed - no other lesions or sources of bleeding No lesions of oral cavity/oropharynx; dentition wnl No obviously palpable neck masses/lymphadenopathy/thyromegaly No respiratory distress or stridor  Seprately Identifiable Procedures:  PROCEDURE NOTE: Preoperative diagnosis: right epistaxis Postoperative diagnosis: same  Procedure: control of nasal hemorrhage anterior, simple (CPT 56213) Indication: Epistaxis EBL: 0 mL Provider: Lorane Rocker PA-C   Procedure: The patient was identified and properly positioned and verbal consent obtained. Topical lidocaine and Afrin were applied to the nasal cavity  bilaterally and allowed to work for 10 minutes. The area over the  right anterior septum which was the suspected source of bleeding was focally cauterized using silver nitrate cautery with help of nasal speculum and headlight. There was no significant bleeding afterwards. Mupirocin ointment was applied over the area. Patient tolerated the procedure well      Impression & Plans:  Sarah Herrera is a 23 y.o. female with the following   Epistaxis-  This is a 23 year old female seen in our office for evaluation of right-sided epistaxis.  This has been a 10-year history.  On exam she does have an area that I believe is the source of her ongoing bleeding.  This is in the anterior right septum.  I discussed the risks and benefits of nasal cautery, no guarantees were made.  The patient did want to attempt nasal cautery.  This was completed without difficulty.  I did encourage her to continue nasal saline rinses, I also encouraged bacitracin  or Vaseline at the opening of the nose along with humidification.  I would like to see her back in the office if she has any recurrent nosebleeds.  The patient verbalized understanding and agreement to today's plan had no further questions or concerns.   - f/u as needed   Thank you for allowing me the opportunity to care for your patient. Please do not hesitate to contact me should you have any other questions.  Sincerely, Belma Boxer PA-C [facility] Phone: [phone] Fax: [phone]  01/10/2024, 9:12 AM

## 2024-01-11 DIAGNOSIS — Z30015 Encounter for initial prescription of vaginal ring hormonal contraceptive: Secondary | ICD-10-CM | POA: Insufficient documentation

## 2024-01-12 ENCOUNTER — Other Ambulatory Visit (HOSPITAL_COMMUNITY): Payer: Self-pay

## 2024-01-12 ENCOUNTER — Encounter (INDEPENDENT_AMBULATORY_CARE_PROVIDER_SITE_OTHER): Payer: Self-pay

## 2024-01-12 ENCOUNTER — Telehealth: Admitting: Family Medicine

## 2024-01-12 DIAGNOSIS — R112 Nausea with vomiting, unspecified: Secondary | ICD-10-CM

## 2024-01-12 DIAGNOSIS — J029 Acute pharyngitis, unspecified: Secondary | ICD-10-CM | POA: Diagnosis not present

## 2024-01-12 MED ORDER — AMOXICILLIN 500 MG PO CAPS
500.0000 mg | ORAL_CAPSULE | Freq: Two times a day (BID) | ORAL | 0 refills | Status: AC
  Filled 2024-01-12: qty 20, 10d supply, fill #0

## 2024-01-12 MED ORDER — ONDANSETRON 4 MG PO TBDP
4.0000 mg | ORAL_TABLET | Freq: Three times a day (TID) | ORAL | 0 refills | Status: AC | PRN
  Filled 2024-01-12: qty 20, 7d supply, fill #0

## 2024-01-12 MED ORDER — LIDOCAINE VISCOUS HCL 2 % MT SOLN
15.0000 mL | OROMUCOSAL | 0 refills | Status: AC | PRN
Start: 2024-01-12 — End: ?
  Filled 2024-01-12: qty 100, 3d supply, fill #0

## 2024-01-12 NOTE — Progress Notes (Signed)
 Virtual Visit Consent   Sarah Herrera, you are scheduled for a virtual visit with a [facility] provider today. Just as with appointments in the office, your consent must be obtained to participate. Your consent will be active for this visit and any virtual visit you may have with one of our providers in the next 365 days. If you have a MyChart account, a copy of this consent can be sent to you electronically.  As this is a virtual visit, video technology does not allow for your provider to perform a traditional examination. This may limit your provider's ability to fully assess your condition. If your provider identifies any concerns that need to be evaluated in person or the need to arrange testing (such as labs, EKG, etc.), we will make arrangements to do so. Although advances in technology are sophisticated, we cannot ensure that it will always work on either your end or our end. If the connection with a video visit is poor, the visit may have to be switched to a telephone visit. With either a video or telephone visit, we are not always able to ensure that we have a secure connection.  By engaging in this virtual visit, you consent to the provision of healthcare and authorize for your insurance to be billed (if applicable) for the services provided during this visit. Depending on your insurance coverage, you may receive a charge related to this service.  I need to obtain your verbal consent now. Are you willing to proceed with your visit today? Sarah Herrera has provided verbal consent on 01/12/2024 for a virtual visit (video or telephone). Lanetta Pion, NP  Date: 01/12/2024 9:53 AM   Virtual Visit via Video Note   I, Lanetta Pion, connected with  Sarah Herrera  ([id number], 2001-04-07) on 01/12/24 at 10:00 AM EDT by a video-enabled telemedicine application and verified that I am speaking with the correct person using two identifiers.  Location: Patient: Virtual Visit Location Patient:  Home Provider: Virtual Visit Location Provider: Home Office   I discussed the limitations of evaluation and management by telemedicine and the availability of in person appointments. The patient expressed understanding and agreed to proceed.    History of Present Illness: Sarah Herrera is a 23 y.o. who identifies as a female who was assigned female at birth, and is being seen today for strep  Onset was within the last 2 days- was hard to tell given the recent cauterization from epistaxis. Painful- stabbing and raw feeling with swallowing. Modifying factors are increased water, OTC tylenol. Denies chest pain, shortness of breath, fevers, chills Home test + for strep Exposure to sick contacts- unknown- but took little brother out to a bounce house over weekend.  Problems:  Patient Active Problem List   Diagnosis Date Noted   Encounter for initial prescription of vaginal ring hormonal contraceptive 01/11/2024   Recurrent epistaxis 01/05/2024   Dysmenorrhea 08/20/2022   Lesion of subcutaneous tissue 08/20/2022   Encounter for initial prescription of contraceptive pills 08/20/2022   Alternating constipation and diarrhea 10/13/2021   Anxiety disorder 10/13/2021   Gastroesophageal reflux disease 10/13/2021   Migraine headache without aura 10/13/2021    Allergies: No Known Allergies Medications:  Current Outpatient Medications:    cetirizine (ZYRTEC) 10 MG tablet, Take 10 mg by mouth daily., Disp: , Rfl:    etonogestrel-ethinyl estradiol  (NUVARING) 0.12-0.015 MG/24HR vaginal ring, Insert vaginally and leave in place for 3 consecutive weeks, then replace, Disp: 4 each, Rfl:  5   norethindrone  (INCASSIA ) 0.35 MG tablet, Take 1 tablet by mouth daily., Disp: 84 tablet, Rfl: 1   tretinoin  (RETIN-A ) 0.05 % cream, Apply a pea size amount to the entire face nightly as tolerated, Disp: 20 g, Rfl: 6  Observations/Objective: Patient is well-developed, well-nourished in no acute distress.  Resting  comfortably  at home.  Head is normocephalic, atraumatic.  No labored breathing.  Speech is clear and coherent with logical content.  Patient is alert and oriented at baseline.  Noted mild voice change- not a full on hot potato sound  Assessment and Plan:  1. Pharyngitis, unspecified etiology (Primary)   - amoxicillin (AMOXIL) 500 MG tablet; Take 1 tablet (500 mg total) by mouth 2 (two) times daily for 10 days.  Dispense: 20 tablet; Refill: 0 - lidocaine (XYLOCAINE) 2 % solution; Use as directed 15 mLs in the mouth or throat as needed for mouth pain.  Dispense: 100 mL; Refill: 0   + home strep test, will treat accordingly   -Suspect bacterial pharyngitis - Discussed OTC management - Will prescribe Viscous lidocaine for symptomatic relief, along with ABX for bacterial cover - May use tylenol and ibuprofen alternating every 3-4 hours as needed for pain, fevers, body aches - Salt water gargles - Hydration and voice rest - Seek in person evaluation if worsening or fails to improve  Reviewed side effects, risks and benefits of medication.    Patient acknowledged agreement and understanding of the plan.   Past Medical, Surgical, Social History, Allergies, and Medications have been Reviewed.     Follow Up Instructions: I discussed the assessment and treatment plan with the patient. The patient was provided an opportunity to ask questions and all were answered. The patient agreed with the plan and demonstrated an understanding of the instructions.  A copy of instructions were sent to the patient via MyChart unless otherwise noted below.    The patient was advised to call back or seek an in-person evaluation if the symptoms worsen or if the condition fails to improve as anticipated.    Lanetta Pion, NP

## 2024-01-12 NOTE — Addendum Note (Signed)
 Addended by: Lanetta Pion on: 01/12/2024 11:59 AM   Modules accepted: Orders

## 2024-01-12 NOTE — Patient Instructions (Signed)
 Thermon Flattery, thank you for joining Lanetta Pion, NP for today's virtual visit.  While this provider is not your primary care provider (PCP), if your PCP is located in our provider database this encounter information will be shared with them immediately following your visit.   A [facility] MyChart account gives you access to today's visit and all your visits, tests, and labs performed at [facility] " click here if you don't have a [facility] MyChart account or go to [email or web address]  Consent: (Patient) Sarah Herrera provided verbal consent for this virtual visit at the beginning of the encounter.  Current Medications:  Current Outpatient Medications:    amoxicillin (AMOXIL) 500 MG tablet, Take 1 tablet (500 mg total) by mouth 2 (two) times daily for 10 days., Disp: 20 tablet, Rfl: 0   lidocaine (XYLOCAINE) 2 % solution, Use as directed 15 mLs in the mouth or throat as needed for mouth pain., Disp: 100 mL, Rfl: 0   cetirizine (ZYRTEC) 10 MG tablet, Take 10 mg by mouth daily., Disp: , Rfl:    etonogestrel-ethinyl estradiol  (NUVARING) 0.12-0.015 MG/24HR vaginal ring, Insert vaginally and leave in place for 3 consecutive weeks, then replace, Disp: 4 each, Rfl: 5   norethindrone  (INCASSIA ) 0.35 MG tablet, Take 1 tablet by mouth daily., Disp: 84 tablet, Rfl: 1   tretinoin  (RETIN-A ) 0.05 % cream, Apply a pea size amount to the entire face nightly as tolerated, Disp: 20 g, Rfl: 6   Medications ordered in this encounter:  Meds ordered this encounter  Medications   amoxicillin (AMOXIL) 500 MG tablet    Sig: Take 1 tablet (500 mg total) by mouth 2 (two) times daily for 10 days.    Dispense:  20 tablet    Refill:  0    Supervising Provider:   Corine Dice [[id number]]   lidocaine (XYLOCAINE) 2 % solution    Sig: Use as directed 15 mLs in the mouth or throat as needed for mouth pain.    Dispense:  100 mL    Refill:  0    Supervising Provider:   Corine Dice  [[id number]]     *If you need refills on other medications prior to your next appointment, please contact your pharmacy*  Follow-Up: Call back or seek an in-person evaluation if the symptoms worsen or if the condition fails to improve as anticipated.  [facility] [phone]  Other Instructions  Feel better soon!    -Suspect bacterial pharyngitis - Discussed OTC management - Will prescribe Viscous lidocaine for symptomatic relief, along with ABX for bacterial cover - May use tylenol and ibuprofen alternating every 3-4 hours as needed for pain, fevers, body aches - Salt water gargles - Hydration and voice rest - Seek in person evaluation if worsening or fails to improve    If you have been instructed to have an in-person evaluation today at a local Urgent Care facility, please use the link below. It will take you to a list of all of our available [facility], including address, phone number and hours of operation. Please do not delay care.  [facility]  If you or a family member do not have a primary care provider, use the link below to schedule a visit and establish care. When you choose a [facility] primary care physician or advanced practice provider, you gain a long-term partner in health. Find a Primary Care Provider  Learn more about [facility]  in-office and virtual care options: [facility] Get Care Now

## 2024-01-16 ENCOUNTER — Encounter (HOSPITAL_BASED_OUTPATIENT_CLINIC_OR_DEPARTMENT_OTHER): Payer: Commercial Managed Care - PPO | Admitting: Obstetrics & Gynecology

## 2024-01-16 ENCOUNTER — Inpatient Hospital Stay (HOSPITAL_BASED_OUTPATIENT_CLINIC_OR_DEPARTMENT_OTHER): Admitting: Hematology and Oncology

## 2024-01-16 DIAGNOSIS — R040 Epistaxis: Secondary | ICD-10-CM

## 2024-01-16 NOTE — Assessment & Plan Note (Signed)
 Workup: 01/05/2024: Platelet function assay: Normal, factor VIII activity: 148%, factor VIII assay: 128%, APTT 28, PT 13.1, INR 1 Discussion: Based on the above results there is no hematological basis for her epistaxis.  There are no coagulation abnormalities.  There are also no platelet function abnormalities.  Further follow-up with ENT and primary care are suggested.

## 2024-01-16 NOTE — Progress Notes (Signed)
 HEMATOLOGY-ONCOLOGY TELEPHONE VISIT PROGRESS NOTE  I connected with our patient on 01/16/24 at  8:30 AM EDT by telephone and verified that I am speaking with the correct person using two identifiers.  I discussed the limitations, risks, security and privacy concerns of performing an evaluation and management service by telephone and the availability of in person appointments.  I also discussed with the patient that there may be a patient responsible charge related to this service. The patient expressed understanding and agreed to proceed.   History of Present Illness:   History of Present Illness Sarah Herrera is a 23 year old female who presents with history of recurrent epistaxis.  She underwent nasal cauterization last week, which has successfully prevented further episodes of epistaxis. Laboratory results show no hematological abnormalities related to her epistaxis.    REVIEW OF SYSTEMS:   Constitutional: Denies fevers, chills or abnormal weight loss All other systems were reviewed with the patient and are negative. Observations/Objective:     Assessment Plan:  Recurrent epistaxis Workup: 01/05/2024: Platelet function assay: Normal, factor VIII activity: 148%, factor VIII assay: 128%, APTT 28, PT 13.1, INR 1 Discussion: Based on the above results there is no hematological basis for her epistaxis.  There are no coagulation abnormalities.  There are also no platelet function abnormalities. No further epistaxis since she had cauterization.  Further follow-up with ENT and primary care are suggested.       I discussed the assessment and treatment plan with the patient. The patient was provided an opportunity to ask questions and all were answered. The patient agreed with the plan and demonstrated an understanding of the instructions. The patient was advised to call back or seek an in-person evaluation if the symptoms worsen or if the condition fails to improve as anticipated.   I  provided 20 minutes of non-face-to-face time during this encounter.  This includes time for charting and coordination of care   [name], MD

## 2024-01-20 ENCOUNTER — Other Ambulatory Visit (HOSPITAL_COMMUNITY): Payer: Self-pay

## 2024-03-03 ENCOUNTER — Other Ambulatory Visit (HOSPITAL_COMMUNITY): Payer: Self-pay

## 2024-03-05 ENCOUNTER — Other Ambulatory Visit (HOSPITAL_COMMUNITY): Payer: Self-pay

## 2024-03-16 ENCOUNTER — Ambulatory Visit (INDEPENDENT_AMBULATORY_CARE_PROVIDER_SITE_OTHER): Admitting: Psychology

## 2024-03-16 DIAGNOSIS — F411 Generalized anxiety disorder: Secondary | ICD-10-CM | POA: Diagnosis not present

## 2024-03-16 NOTE — Progress Notes (Unsigned)
                edge,  experiencing concentration difficulties, having trouble falling or staying asleep, exhibiting a general  state of irritability).: No Description Entered (Status: improved). Motor tension (e.g., restlessness,  tiredness, shakiness, muscle tension).: No Description Entered (Status: improved).  Problems Addressed  Anxiety, Phase Of Life Problems, Anxiety  Goals 1. Learn and implement coping skills that result in a reduction of anxiety  and worry, and improved daily functioning. Objective Learn  and implement calming skills to reduce overall anxiety and manage anxiety symptoms. Target Date: 2025-08-09Frequency: Weekly Progress: 40 Modality: individual  Related Interventions 1. Teach the client calming/relaxation skills (e.g., applied relaxation, progressive muscle  relaxation, cue controlled relaxation; mindful breathing; biofeedback) and how to discriminate  better between relaxation and tension; teach the client how to apply these skills to his/her daily  life (e.g., New Directions in Progressive Muscle Relaxation by Marcelyn Ditty, and  Hazlett-Stevens; Treating Generalized Anxiety Disorder by Rygh and Ida Rogue). Objective Identify, challenge, and replace biased, fearful self-talk with positive, realistic, and empowering selftalk. Target Date: 2024-04-14 Frequency: weekly Progress: 30 Modality: individual Related Interventions 1. Explore the client's schema and self-talk that mediate his/her fear response; assist him/her in  challenging the biases; replace the distorted messages with reality-based alternatives and  positive, realistic self-talk that will increase his/her self-confidence in coping with irrational  fears (see Cognitive Therapy of Anxiety Disorders by Laurence Slate). Objective Learn and implement problem-solving strategies for realistically addressing worries. Target Date: 2025-08-09Frequency: weekly Progress: 40 Modality: individual 2. Resolve conflicted feelings and adapt to the new life circumstances. Objective Apply problem-solving skills to current circumstances. Target Date: 2024-04-14 Frequency: weekly Progress: 20 Modality: individual Related Interventions 1. Teach the client problem-resolution skills (e.g., defining the problem clearly, brainstorming  multiple solutions, listing the pros and cons of each solution, seeking input from others,  selecting and implementing a plan of action, evaluating outcome, and readjusting plan as   necessary).   3. Stabilize anxiety level while increasing ability to function on a daily  basis. Diagnosis F33.1  Major depressive disorder, moderate 300.02 (Generalized anxiety disorder) - Open - [Signifier: n/a]  Axis  none 309.28 (Adjustment disorder with mixed anxiety and depressed  mood) - Open - [Signifier: n/a]  Adjustment Disorder,  With Anxiety   Marital conflict  Major Depressive disorder, moderate  Conditions For Discharge Achievement of treatment goals and objectives.  The patient approved this plan.   Deonna Krummel G Ethridge Sollenberger, LCSW

## 2024-03-28 ENCOUNTER — Encounter: Payer: Self-pay | Admitting: Family Medicine

## 2024-04-15 ENCOUNTER — Encounter (HOSPITAL_BASED_OUTPATIENT_CLINIC_OR_DEPARTMENT_OTHER): Payer: Self-pay | Admitting: Certified Nurse Midwife

## 2024-05-21 DIAGNOSIS — R350 Frequency of micturition: Secondary | ICD-10-CM | POA: Diagnosis not present

## 2024-05-21 DIAGNOSIS — R300 Dysuria: Secondary | ICD-10-CM | POA: Diagnosis not present

## 2024-06-04 ENCOUNTER — Other Ambulatory Visit: Payer: Self-pay

## 2024-06-04 ENCOUNTER — Other Ambulatory Visit (HOSPITAL_COMMUNITY): Payer: Self-pay

## 2024-08-16 ENCOUNTER — Ambulatory Visit (HOSPITAL_BASED_OUTPATIENT_CLINIC_OR_DEPARTMENT_OTHER): Admitting: Obstetrics and Gynecology

## 2024-08-16 ENCOUNTER — Encounter (HOSPITAL_BASED_OUTPATIENT_CLINIC_OR_DEPARTMENT_OTHER): Payer: Self-pay | Admitting: Obstetrics and Gynecology

## 2024-08-16 ENCOUNTER — Other Ambulatory Visit (HOSPITAL_COMMUNITY)
Admission: RE | Admit: 2024-08-16 | Discharge: 2024-08-16 | Disposition: A | Discharge status: Home / Self Care | Source: Ambulatory Visit | Attending: Obstetrics and Gynecology | Admitting: Obstetrics and Gynecology

## 2024-08-16 VITALS — BP 116/76 | HR 81 | Ht 67.0 in | Wt 144.0 lb

## 2024-08-16 DIAGNOSIS — Z01419 Encounter for gynecological examination (general) (routine) without abnormal findings: Secondary | ICD-10-CM

## 2024-08-16 DIAGNOSIS — Z124 Encounter for screening for malignant neoplasm of cervix: Secondary | ICD-10-CM | POA: Diagnosis not present

## 2024-08-16 NOTE — Progress Notes (Signed)
 ANNUAL EXAM Patient name: Sarah Herrera MRN [id number]  Date of birth: May 27, 2001 Chief Complaint:   Office visit (Pap)  History of Present Illness:   Sarah Herrera is a 23 y.o. G0P0000  female being seen today for a routine annual exam.  Current complaints: Uses nuvaring for birth control and uses it continuously, skips period week. Had two break through bleeding episodes  with nuvaring, when that occurs removes it for 7 days. otherwise doing well. Declines STD screen today.   PCP [facility]   Patient's last menstrual period was 07/10/2024.   The pregnancy intention screening data noted above was reviewed. Potential methods of contraception were discussed. The patient elected to proceed with Nuvaring  Gynecologic History Contraception: Nuvaring  Last Pap: n/a. Results were: Normal Last mammogram: n/a. Hx of breast cancer:Maternal grandmother  HPV vaccination 2017    08/16/2024    3:21 PM 01/10/2024    2:33 PM 08/20/2022    9:53 AM 10/13/2021    2:33 PM 08/21/2021    8:58 AM  Depression screen PHQ 2/9  Decreased Interest 1 0 0 0 1  Down, Depressed, Hopeless 1 0 0 0 0  PHQ - 2 Score 2 0 0 0 1  Altered sleeping 2  1 1  0  Tired, decreased energy 1  1 1 1   Change in appetite 2  0 1 1  Feeling bad or failure about yourself  1  0 1 0  Trouble concentrating 1  0 0 1  Moving slowly or fidgety/restless 0  0 0 0  Suicidal thoughts 0  0 0 0  PHQ-9 Score 9  2  4  4    Difficult doing work/chores Not difficult at all  Not difficult at all Not difficult at all Somewhat difficult     Data saved with a previous flowsheet row definition        08/16/2024    3:23 PM 08/23/2021    8:59 AM  GAD 7 : Generalized Anxiety Score  Nervous, Anxious, on Edge 1 3  Control/stop worrying 1 2  Worry too much - different things 1 3  Trouble relaxing 2 2  Restless 0 2  Easily annoyed or irritable 1 1  Afraid - awful might happen 1 1  Total GAD 7 Score 7 14  Anxiety Difficulty Not difficult at  all Somewhat difficult     Review of Systems:   Pertinent items are noted in HPI Denies any headaches, blurred vision, fatigue, shortness of breath, chest pain, abdominal pain, abnormal vaginal discharge/itching/odor/irritation, problems with periods, bowel movements, urination, or intercourse unless otherwise stated above. Pertinent History Reviewed:  Reviewed past medical,surgical, social and family history.  Reviewed problem list, medications and allergies. Physical Assessment:   Vitals:   08/16/24 1513  BP: 116/76  Pulse: 81  Weight: 144 lb (65.3 kg)  Height: 5' 7 (1.702 m)  Body mass index is 22.55 kg/m.        Physical Examination:   General appearance - well appearing, and in no distress  Mental status - alert, oriented   Psych:  She has a normal mood and affect  Skin - warm and dry, normal color  Chest - effort normal, all lung fields clear to auscultation bilaterally  Heart - normal rate and regular rhythm  Neck:  midline trachea, no thyromegaly or nodules  Breasts - breasts appear normal, no suspicious masses, no skin or nipple changes or  axillary nodes  Abdomen - soft, nontender, nondistended  Pelvic - VULVA: normal appearing vulva with no masses, tenderness or lesions  VAGINA: normal appearing vagina with normal color and discharge, no lesions  CERVIX: normal appearing cervix without discharge or lesions, No CMT  Thin prep pap is done   Extremities:  No swelling or varicosities noted  Chaperone present for exam  No results found for this or any previous visit (from the past 24 hours).  Assessment & Plan:  1. Encounter for annual routine gynecological examination (Primary) First pap discussed and completed today, discussed ASCCP guidelines Encouraged self breast exam, discussed option for genetic screening given family hx breast cancer  Continue Nuvaring HPV vaccination completed in 2017 Established with PCP, does not desire screening or lab work today  2.  Cervical cancer screening  - Cytology - PAP( [place])   Labs/procedures today:   Mammogram: @ 21yo, or sooner if problems Colonoscopy: @ 21yo, or sooner if problems  No orders of the defined types were placed in this encounter.   Meds: No orders of the defined types were placed in this encounter.   Follow-up: Return in about 1 year (around 08/16/2025) for [name], FNP

## 2024-08-17 ENCOUNTER — Ambulatory Visit (HOSPITAL_BASED_OUTPATIENT_CLINIC_OR_DEPARTMENT_OTHER): Admitting: Certified Nurse Midwife

## 2024-08-17 LAB — CYTOLOGY - PAP: Diagnosis: NEGATIVE

## 2024-08-20 ENCOUNTER — Ambulatory Visit: Payer: Self-pay | Admitting: Obstetrics and Gynecology

## 2024-09-10 ENCOUNTER — Other Ambulatory Visit: Payer: Self-pay
